# Patient Record
Sex: Female | Born: 1955
Health system: Southern US, Community
[De-identification: ages and names within clinical notes are randomized; demographics above are authoritative.]

## PROBLEM LIST (undated history)

## (undated) DIAGNOSIS — I10 Essential (primary) hypertension: Secondary | ICD-10-CM

## (undated) DIAGNOSIS — M8580 Other specified disorders of bone density and structure, unspecified site: Secondary | ICD-10-CM

## (undated) DIAGNOSIS — L719 Rosacea, unspecified: Secondary | ICD-10-CM

## (undated) DIAGNOSIS — N879 Dysplasia of cervix uteri, unspecified: Secondary | ICD-10-CM

## (undated) DIAGNOSIS — H44009 Unspecified purulent endophthalmitis, unspecified eye: Secondary | ICD-10-CM

## (undated) HISTORY — PX: MANDIBLE SURGERY: SHX707

## (undated) HISTORY — PX: EYE SURGERY: SHX253

## (undated) HISTORY — PX: PELVIC LAPAROSCOPY: SHX162

## (undated) HISTORY — DX: Dysplasia of cervix uteri, unspecified: N87.9

## (undated) HISTORY — DX: Essential (primary) hypertension: I10

## (undated) HISTORY — DX: Other specified disorders of bone density and structure, unspecified site: M85.80

## (undated) HISTORY — DX: Rosacea, unspecified: L71.9

## (undated) HISTORY — PX: APPENDECTOMY: SHX54

## (undated) HISTORY — DX: Unspecified purulent endophthalmitis, unspecified eye: H44.009

---

## 1989-01-27 HISTORY — PX: ABDOMINAL HYSTERECTOMY: SHX81

## 1997-12-28 ENCOUNTER — Other Ambulatory Visit: Admission: RE | Admit: 1997-12-28 | Discharge: 1997-12-28 | Payer: Self-pay | Admitting: Gynecology

## 1999-01-03 ENCOUNTER — Other Ambulatory Visit: Admission: RE | Admit: 1999-01-03 | Discharge: 1999-01-03 | Payer: Self-pay | Admitting: Gynecology

## 2000-01-08 ENCOUNTER — Other Ambulatory Visit: Admission: RE | Admit: 2000-01-08 | Discharge: 2000-01-08 | Payer: Self-pay | Admitting: Gynecology

## 2001-01-04 ENCOUNTER — Other Ambulatory Visit: Admission: RE | Admit: 2001-01-04 | Discharge: 2001-01-04 | Payer: Self-pay | Admitting: Gynecology

## 2001-12-29 ENCOUNTER — Other Ambulatory Visit: Admission: RE | Admit: 2001-12-29 | Discharge: 2001-12-29 | Payer: Self-pay | Admitting: Gynecology

## 2002-12-29 ENCOUNTER — Other Ambulatory Visit: Admission: RE | Admit: 2002-12-29 | Discharge: 2002-12-29 | Payer: Self-pay | Admitting: Gynecology

## 2004-01-25 ENCOUNTER — Other Ambulatory Visit: Admission: RE | Admit: 2004-01-25 | Discharge: 2004-01-25 | Payer: Self-pay | Admitting: Gynecology

## 2004-04-10 ENCOUNTER — Encounter: Payer: Self-pay | Admitting: Internal Medicine

## 2005-01-23 ENCOUNTER — Other Ambulatory Visit: Admission: RE | Admit: 2005-01-23 | Discharge: 2005-01-23 | Payer: Self-pay | Admitting: Gynecology

## 2005-05-15 ENCOUNTER — Other Ambulatory Visit: Admission: RE | Admit: 2005-05-15 | Discharge: 2005-05-15 | Payer: Self-pay | Admitting: Gynecology

## 2006-01-07 ENCOUNTER — Other Ambulatory Visit: Admission: RE | Admit: 2006-01-07 | Discharge: 2006-01-07 | Payer: Self-pay | Admitting: Gynecology

## 2006-07-15 ENCOUNTER — Other Ambulatory Visit: Admission: RE | Admit: 2006-07-15 | Discharge: 2006-07-15 | Payer: Self-pay | Admitting: Gynecology

## 2006-09-09 ENCOUNTER — Ambulatory Visit: Payer: Self-pay | Admitting: Internal Medicine

## 2006-09-09 DIAGNOSIS — J309 Allergic rhinitis, unspecified: Secondary | ICD-10-CM | POA: Insufficient documentation

## 2006-09-09 DIAGNOSIS — M1990 Unspecified osteoarthritis, unspecified site: Secondary | ICD-10-CM | POA: Insufficient documentation

## 2006-09-09 DIAGNOSIS — M949 Disorder of cartilage, unspecified: Secondary | ICD-10-CM

## 2006-09-09 DIAGNOSIS — N951 Menopausal and female climacteric states: Secondary | ICD-10-CM | POA: Insufficient documentation

## 2006-09-09 DIAGNOSIS — M899 Disorder of bone, unspecified: Secondary | ICD-10-CM | POA: Insufficient documentation

## 2006-09-09 DIAGNOSIS — I10 Essential (primary) hypertension: Secondary | ICD-10-CM | POA: Insufficient documentation

## 2006-09-23 ENCOUNTER — Encounter: Payer: Self-pay | Admitting: Internal Medicine

## 2006-11-13 ENCOUNTER — Ambulatory Visit: Payer: Self-pay | Admitting: Internal Medicine

## 2006-11-30 ENCOUNTER — Ambulatory Visit: Payer: Self-pay | Admitting: Internal Medicine

## 2006-11-30 DIAGNOSIS — J019 Acute sinusitis, unspecified: Secondary | ICD-10-CM | POA: Insufficient documentation

## 2006-12-14 ENCOUNTER — Telehealth (INDEPENDENT_AMBULATORY_CARE_PROVIDER_SITE_OTHER): Payer: Self-pay | Admitting: *Deleted

## 2006-12-14 ENCOUNTER — Ambulatory Visit: Payer: Self-pay | Admitting: Internal Medicine

## 2006-12-14 DIAGNOSIS — L509 Urticaria, unspecified: Secondary | ICD-10-CM | POA: Insufficient documentation

## 2006-12-30 ENCOUNTER — Other Ambulatory Visit: Admission: RE | Admit: 2006-12-30 | Discharge: 2006-12-30 | Payer: Self-pay | Admitting: Gynecology

## 2007-02-01 ENCOUNTER — Encounter: Admission: RE | Admit: 2007-02-01 | Discharge: 2007-02-01 | Payer: Self-pay | Admitting: Family Medicine

## 2007-12-30 ENCOUNTER — Other Ambulatory Visit: Admission: RE | Admit: 2007-12-30 | Discharge: 2007-12-30 | Payer: Self-pay | Admitting: Gynecology

## 2015-02-08 ENCOUNTER — Ambulatory Visit (INDEPENDENT_AMBULATORY_CARE_PROVIDER_SITE_OTHER): Payer: BLUE CROSS/BLUE SHIELD | Admitting: Gynecology

## 2015-02-08 ENCOUNTER — Encounter: Payer: Self-pay | Admitting: Gynecology

## 2015-02-08 VITALS — BP 140/88 | Ht 59.0 in | Wt 122.0 lb

## 2015-02-08 DIAGNOSIS — N952 Postmenopausal atrophic vaginitis: Secondary | ICD-10-CM

## 2015-02-08 DIAGNOSIS — Z01419 Encounter for gynecological examination (general) (routine) without abnormal findings: Secondary | ICD-10-CM

## 2015-02-08 DIAGNOSIS — N761 Subacute and chronic vaginitis: Secondary | ICD-10-CM | POA: Diagnosis not present

## 2015-02-08 DIAGNOSIS — Z79890 Hormone replacement therapy (postmenopausal): Secondary | ICD-10-CM

## 2015-02-08 MED ORDER — ESTRADIOL 0.05 MG/24HR TD PTTW: 1.0000 | MEDICATED_PATCH | TRANSDERMAL | Status: DC

## 2015-02-08 NOTE — Progress Notes (Signed)
Shelly Branch 10/19/1955 [id number]        60 y.o.  G1P1  for annual exam.  Former patient of Dr. Lorenso CourierMesser. Patient doing well without complaints  Past medical history,surgical history, problem list, medications, allergies, family history and social history were all reviewed and documented as reviewed in the EPIC chart.  ROS:  Performed with pertinent positives and negatives included in the history, assessment and plan.   Additional significant findings :  none   Exam: Kennon PortelaKim Gardner assistant Filed Vitals:   02/08/15 1051  BP: 140/88  Height: 4\' 11"  (1.499 m)  Weight: 122 lb (55.339 kg)   General appearance:  Normal affect, orientation and appearance. Skin: Grossly normal HEENT: Without gross lesions.  No cervical or supraclavicular adenopathy. Thyroid normal.  Lungs:  Clear without wheezing, rales or rhonchi Cardiac: RR, without RMG Abdominal:  Soft, nontender, without masses, guarding, rebound, organomegaly or hernia Breasts:  Examined lying and sitting without masses, retractions, discharge or axillary adenopathy. Pelvic:  Ext/BUS/vagina with atrophic changes  Adnexa  Without masses or tenderness    Anus and perineum  Normal   Rectovaginal  Normal sphincter tone without palpated masses or tenderness.    Assessment/Plan:  60 y.o. G1P1 female for annual exam.   1. Postmenopausal/atrophic genital changes.  Status post TAH/BSO 1991 for endometriosis. On Vivelle 0.05 mg patches. Doing well and wants to continue. Initially started due to unacceptable hot flushes and sweats started after her hysterectomy. I reviewed the whole issue of HRT to include the WHI study with increased risks of stroke heart attack DVT and possible breast cancer. Lowest dose for shortest period of time recommendations reviewed. After lengthy discussion the patient was to continue for now on I refilled her 1 year. 2. History of chronic yeast infections. Uses [facility]  twice weekly. Has done this for years. I discussed possible trying to stop and see how she does. At this point she does not want to do this but does have a supply at home. She'll call when she needs more. 3. Osteopenia. Patient reports having stable osteopenia. Last DEXA 2 years ago. Will obtain copy of this report and then make recommendations as to when to repeat this. Patient knows to call me in 2 weeks in follow up. 4. Pap smear reported 2016. No Pap smear done today.History of cryosurgery over 20-25 years ago. Normal Pap smear since then.  Discussed current screening guidelines and options to stop screening altogether versus less frequent screening intervals reviewed. Will readdress on annual basis. 5. Mammography 2 years ago. Patient knows she is overdue and is in the process of scheduling. SBE monthly reviewed. 6. Colonoscopy 2008 with planned repeat interval 10 years. 7. Health maintenance. No routine blood work done as patient reports this done at her primary physician's office. Mild elevated blood pressure 140/88 today. She is followed for hypertension and will recheck this in non exam situation.  Follow up 1 year, sooner as needed.   Dara LordsFONTAINE,Zeev Deakins P MD, 11:37 AM 02/08/2015

## 2015-02-08 NOTE — Patient Instructions (Signed)

## 2015-02-09 LAB — URINALYSIS W MICROSCOPIC + REFLEX CULTURE
BILIRUBIN URINE: NEGATIVE
Bacteria, UA: NONE SEEN [HPF]
CASTS: NONE SEEN [LPF]
CRYSTALS: NONE SEEN [HPF]
Glucose, UA: NEGATIVE
HGB URINE DIPSTICK: NEGATIVE
KETONES UR: NEGATIVE
Leukocytes, UA: NEGATIVE
Nitrite: NEGATIVE
PROTEIN: NEGATIVE
RBC / HPF: NONE SEEN RBC/HPF (ref <=2)
SPECIFIC GRAVITY, URINE: 1.021 (ref 1.001–1.035)
WBC UA: NONE SEEN WBC/HPF (ref <=5)
Yeast: NONE SEEN [HPF]
pH: 7 (ref 5.0–8.0)

## 2015-02-13 ENCOUNTER — Encounter: Payer: Self-pay | Admitting: Gynecology

## 2015-02-16 ENCOUNTER — Encounter: Payer: Self-pay | Admitting: Gynecology

## 2015-02-23 ENCOUNTER — Other Ambulatory Visit: Payer: Self-pay

## 2015-02-23 DIAGNOSIS — Z1231 Encounter for screening mammogram for malignant neoplasm of breast: Secondary | ICD-10-CM

## 2015-02-28 DIAGNOSIS — M8580 Other specified disorders of bone density and structure, unspecified site: Secondary | ICD-10-CM

## 2015-02-28 HISTORY — DX: Other specified disorders of bone density and structure, unspecified site: M85.80

## 2015-03-01 ENCOUNTER — Other Ambulatory Visit: Payer: Self-pay | Admitting: Gynecology

## 2015-03-01 DIAGNOSIS — M8580 Other specified disorders of bone density and structure, unspecified site: Secondary | ICD-10-CM

## 2015-03-09 ENCOUNTER — Ambulatory Visit: Payer: Self-pay

## 2015-03-20 ENCOUNTER — Ambulatory Visit (INDEPENDENT_AMBULATORY_CARE_PROVIDER_SITE_OTHER): Payer: BLUE CROSS/BLUE SHIELD

## 2015-03-20 ENCOUNTER — Other Ambulatory Visit: Payer: Self-pay | Admitting: Gynecology

## 2015-03-20 DIAGNOSIS — M899 Disorder of bone, unspecified: Secondary | ICD-10-CM

## 2015-03-20 DIAGNOSIS — M8580 Other specified disorders of bone density and structure, unspecified site: Secondary | ICD-10-CM

## 2015-03-20 DIAGNOSIS — Z13820 Encounter for screening for osteoporosis: Secondary | ICD-10-CM

## 2015-03-21 ENCOUNTER — Encounter: Payer: Self-pay | Admitting: Gynecology

## 2015-03-22 ENCOUNTER — Ambulatory Visit
Admission: RE | Admit: 2015-03-22 | Discharge: 2015-03-22 | Disposition: A | Discharge status: Home / Self Care | Payer: BLUE CROSS/BLUE SHIELD | Source: Ambulatory Visit

## 2015-03-22 DIAGNOSIS — Z1231 Encounter for screening mammogram for malignant neoplasm of breast: Secondary | ICD-10-CM

## 2015-05-15 ENCOUNTER — Telehealth: Payer: Self-pay | Admitting: *Deleted

## 2015-05-15 MED ORDER — NONFORMULARY OR COMPOUNDED ITEM: Status: DC

## 2015-05-15 NOTE — Telephone Encounter (Signed)
Pt called requesting refill on boric acid 600 mg twice weekly per note on 02/08/15 Rx will be sent. Should pt have any refills?

## 2015-05-15 NOTE — Telephone Encounter (Signed)
Pt aware, Rx sent. 

## 2015-05-15 NOTE — Telephone Encounter (Signed)
Refill 3 

## 2015-05-29 DIAGNOSIS — R002 Palpitations: Secondary | ICD-10-CM | POA: Diagnosis not present

## 2015-05-29 DIAGNOSIS — Z0000 Encounter for general adult medical examination without abnormal findings: Secondary | ICD-10-CM | POA: Diagnosis not present

## 2015-05-29 DIAGNOSIS — I10 Essential (primary) hypertension: Secondary | ICD-10-CM | POA: Diagnosis not present

## 2015-10-15 DIAGNOSIS — H01001 Unspecified blepharitis right upper eyelid: Secondary | ICD-10-CM | POA: Diagnosis not present

## 2015-10-15 DIAGNOSIS — H01004 Unspecified blepharitis left upper eyelid: Secondary | ICD-10-CM | POA: Diagnosis not present

## 2015-10-15 DIAGNOSIS — H5203 Hypermetropia, bilateral: Secondary | ICD-10-CM | POA: Diagnosis not present

## 2016-02-11 ENCOUNTER — Ambulatory Visit (INDEPENDENT_AMBULATORY_CARE_PROVIDER_SITE_OTHER): Payer: BLUE CROSS/BLUE SHIELD | Admitting: Gynecology

## 2016-02-11 ENCOUNTER — Telehealth: Payer: Self-pay | Admitting: *Deleted

## 2016-02-11 ENCOUNTER — Encounter: Payer: Self-pay | Admitting: Gynecology

## 2016-02-11 VITALS — BP 120/78 | Ht 59.0 in | Wt 123.0 lb

## 2016-02-11 DIAGNOSIS — Z79890 Hormone replacement therapy (postmenopausal): Secondary | ICD-10-CM

## 2016-02-11 DIAGNOSIS — N952 Postmenopausal atrophic vaginitis: Secondary | ICD-10-CM

## 2016-02-11 DIAGNOSIS — Z01411 Encounter for gynecological examination (general) (routine) with abnormal findings: Secondary | ICD-10-CM | POA: Diagnosis not present

## 2016-02-11 MED ORDER — NONFORMULARY OR COMPOUNDED ITEM: 3 refills | Status: DC

## 2016-02-11 MED ORDER — ESTRADIOL 0.05 MG/24HR TD PTTW: 1.0000 | MEDICATED_PATCH | TRANSDERMAL | 12 refills | Status: DC

## 2016-02-11 NOTE — Telephone Encounter (Signed)
-----   Message from Dara Lordsimothy P Fontaine, MD sent at 02/11/2016 11:51 AM EST ----- [facility] b[facility] acid vaginal suppository 600 mg 3 times weekly 3 month supply refill 1 year

## 2016-02-11 NOTE — Patient Instructions (Signed)

## 2016-02-11 NOTE — Progress Notes (Signed)
    Shelly LeyLinda Branch 09/19/1955 [id number]        61 y.o.  G1P1 for annual exam.    Past medical history,surgical history, problem list, medications, allergies, family history and social history were all reviewed and documented as reviewed in the EPIC chart.  ROS:  Performed with pertinent positives and negatives included in the history, assessment and plan.   Additional significant findings :  None   Exam: Kennon PortelaKim Gardner assistant Vitals:   02/11/16 1124  BP: 120/78  Weight: 123 lb (55.8 kg)  Height: 4\' 11"  (1.499 m)   Body mass index is 24.84 kg/m.  General appearance:  Normal affect, orientation and appearance. Skin: Grossly normal HEENT: Without gross lesions.  No cervical or supraclavicular adenopathy. Thyroid normal.  Lungs:  Clear without wheezing, rales or rhonchi Cardiac: RR, without RMG Abdominal:  Soft, nontender, without masses, guarding, rebound, organomegaly or hernia Breasts:  Examined lying and sitting without masses, retractions, discharge or axillary adenopathy. Pelvic:  Ext, BUS, Vagina with atrophic changes  Adnexa without masses or tenderness    Anus and perineum normal   Rectovaginal normal sphincter tone without palpated masses or tenderness.    Assessment/Plan:  61 y.o. G1P1 female for annual exam.   1. Postmenopausal/atrophic genital changes. Continues on Vivelle 0.05 mg patches doing well. Status post TAH/BSO 1991 for endometriosis. Reviewed most current 2017 [employer] guidelines on HRT. At this point patient wants to continue. Risks to include stroke heart attack DVT and possible breast cancer reviewed. Benefits as far as symptom relief and possible cardiovascular/bone health when started early discussed. Refill 1 year provided. 2. History of chronic vaginitis. Uses boric acid suppositories 2-3 times weekly with good response. [facility]. 3. Osteopenia. DEXA 02/2015 T score -1.8 FRAX 8%/0.9%. Stable from prior DEXA. Plan  repeat DEXA in another year or so.  4. Pap smear 2016. No Pap smear done today. History of cryosurgery over 20 years ago. Options to stop screening altogether per current screening guidelines and hysterectomy history reviewed. Will readdress on annual basis. 5. Mammography 02/2015. Continue with annual mammography when due. SBE monthly reviewed. 6. Colonoscopy 2008. Recommend follow up colonoscopy this coming year and patient will arrange. 7. Health maintenance. No routine blood work done as patient relates this done elsewhere. Follow up 1 year, sooner as needed.   Dara LordsFONTAINE,Jaymir Struble P MD, 11:53 AM 02/11/2016

## 2016-02-11 NOTE — Telephone Encounter (Signed)
Rx called in 

## 2016-05-15 DIAGNOSIS — R05 Cough: Secondary | ICD-10-CM | POA: Diagnosis not present

## 2016-05-15 DIAGNOSIS — J329 Chronic sinusitis, unspecified: Secondary | ICD-10-CM | POA: Diagnosis not present

## 2016-05-15 DIAGNOSIS — H6690 Otitis media, unspecified, unspecified ear: Secondary | ICD-10-CM | POA: Diagnosis not present

## 2016-06-06 DIAGNOSIS — Z8349 Family history of other endocrine, nutritional and metabolic diseases: Secondary | ICD-10-CM | POA: Diagnosis not present

## 2016-06-06 DIAGNOSIS — I10 Essential (primary) hypertension: Secondary | ICD-10-CM | POA: Diagnosis not present

## 2016-06-06 DIAGNOSIS — Z0000 Encounter for general adult medical examination without abnormal findings: Secondary | ICD-10-CM | POA: Diagnosis not present

## 2016-07-28 DIAGNOSIS — J0100 Acute maxillary sinusitis, unspecified: Secondary | ICD-10-CM | POA: Diagnosis not present

## 2016-10-22 DIAGNOSIS — H01004 Unspecified blepharitis left upper eyelid: Secondary | ICD-10-CM | POA: Diagnosis not present

## 2016-10-22 DIAGNOSIS — H01001 Unspecified blepharitis right upper eyelid: Secondary | ICD-10-CM | POA: Diagnosis not present

## 2016-10-22 DIAGNOSIS — H0014 Chalazion left upper eyelid: Secondary | ICD-10-CM | POA: Diagnosis not present

## 2017-02-02 DIAGNOSIS — M542 Cervicalgia: Secondary | ICD-10-CM | POA: Diagnosis not present

## 2017-02-11 ENCOUNTER — Encounter: Payer: Self-pay | Admitting: Gynecology

## 2017-02-11 ENCOUNTER — Ambulatory Visit (INDEPENDENT_AMBULATORY_CARE_PROVIDER_SITE_OTHER): Payer: BLUE CROSS/BLUE SHIELD | Admitting: Gynecology

## 2017-02-11 VITALS — BP 118/74 | Ht 59.0 in | Wt 124.0 lb

## 2017-02-11 DIAGNOSIS — M8580 Other specified disorders of bone density and structure, unspecified site: Secondary | ICD-10-CM | POA: Diagnosis not present

## 2017-02-11 DIAGNOSIS — Z01411 Encounter for gynecological examination (general) (routine) with abnormal findings: Secondary | ICD-10-CM | POA: Diagnosis not present

## 2017-02-11 DIAGNOSIS — Z79890 Hormone replacement therapy (postmenopausal): Secondary | ICD-10-CM

## 2017-02-11 DIAGNOSIS — N761 Subacute and chronic vaginitis: Secondary | ICD-10-CM | POA: Diagnosis not present

## 2017-02-11 DIAGNOSIS — N952 Postmenopausal atrophic vaginitis: Secondary | ICD-10-CM | POA: Diagnosis not present

## 2017-02-11 MED ORDER — ESTRADIOL 0.05 MG/24HR TD PTTW: 1.0000 | MEDICATED_PATCH | TRANSDERMAL | 12 refills | Status: DC

## 2017-02-11 NOTE — Patient Instructions (Addendum)
Follow-up for your bone density as scheduled.  Schedule your mammogram.  Schedule your colonoscopy  Follow-up in 1 year for your annual exam

## 2017-02-11 NOTE — Progress Notes (Signed)
    Peggye LeyLinda Liou 12/16/1955 [id number]        62 y.o.  G1P1 for annual gynecologic exam.  Doing well without gynecologic complaints.  Past medical history,surgical history, problem list, medications, allergies, family history and social history were all reviewed and documented as reviewed in the EPIC chart.  ROS:  Performed with pertinent positives and negatives included in the history, assessment and plan.   Additional significant findings : None   Exam: Kennon PortelaKim Gardner assistant Vitals:   02/11/17 1103  BP: 118/74  Weight: 124 lb (56.2 kg)  Height: 4\' 11"  (1.499 m)   Body mass index is 25.04 kg/m.  General appearance:  Normal affect, orientation and appearance. Skin: Grossly normal HEENT: Without gross lesions.  No cervical or supraclavicular adenopathy. Thyroid normal.  Lungs:  Clear without wheezing, rales or rhonchi Cardiac: RR, without RMG Abdominal:  Soft, nontender, without masses, guarding, rebound, organomegaly or hernia Breasts:  Examined lying and sitting without masses, retractions, discharge or axillary adenopathy. Pelvic:  Ext, BUS, Vagina: With atrophic changes  Adnexa: Without masses or tenderness    Anus and perineum: Normal   Rectovaginal: Normal sphincter tone without palpated masses or tenderness.    Assessment/Plan:  62 y.o. G1P1 female for annual gynecologic exam, status post TAH/BSO for endometriosis 1991.  1. HRT.  Patient continues on Vivelle 0.05 mg patches doing well and wants to continue.  We have discussed the risks to include thrombosis such as stroke heart attack DVT in the breast cancer issue as well as the possible cardiovascular and bone health benefits.  Patient wants to continue and I refilled her times 1 year. 2. History of chronic vaginitis.  Uses boric acid suppositories twice weekly with good suppression.  Has supply at home but will call when needs more. 3. Osteopenia.  DEXA 2017 T score -1.8 FRAX 8% / 0.9%.  Repeat DEXA now a 2-year  interval and patient will schedule. 4. Pap smear 2016.  Pap smear done today.  History of cryosurgery over 20 years ago.  Options to stop screening per current screening guidelines based on hysterectomy history reviewed.  Will readdress on an annual basis. 5. Colonoscopy 2008.  Need to schedule colonoscopy reviewed. 6. Mammography 2017.  Patient knows she needs to schedule and agrees to call and do so.  Breast exam normal today. 7. Health maintenance.  No routine lab work done as patient does this elsewhere.  Follow-up for DEXA, mammogram and colonoscopy.  Follow-up in 1 year for annual exam   Dara Lordsimothy P Fontaine MD, 11:47 AM 02/11/2017

## 2017-02-13 LAB — PAP IG W/ RFLX HPV ASCU

## 2017-03-02 DIAGNOSIS — M542 Cervicalgia: Secondary | ICD-10-CM | POA: Diagnosis not present

## 2017-03-04 DIAGNOSIS — J329 Chronic sinusitis, unspecified: Secondary | ICD-10-CM | POA: Diagnosis not present

## 2017-03-23 ENCOUNTER — Other Ambulatory Visit: Payer: Self-pay

## 2017-03-23 MED ORDER — NONFORMULARY OR COMPOUNDED ITEM: 0 refills | Status: DC

## 2017-03-24 DIAGNOSIS — J209 Acute bronchitis, unspecified: Secondary | ICD-10-CM | POA: Diagnosis not present

## 2017-03-24 DIAGNOSIS — J069 Acute upper respiratory infection, unspecified: Secondary | ICD-10-CM | POA: Diagnosis not present

## 2017-04-01 DIAGNOSIS — J209 Acute bronchitis, unspecified: Secondary | ICD-10-CM | POA: Diagnosis not present

## 2017-05-04 DIAGNOSIS — D23121 Other benign neoplasm of skin of left upper eyelid, including canthus: Secondary | ICD-10-CM | POA: Diagnosis not present

## 2017-05-27 DIAGNOSIS — M542 Cervicalgia: Secondary | ICD-10-CM | POA: Diagnosis not present

## 2017-06-23 ENCOUNTER — Other Ambulatory Visit: Payer: Self-pay | Admitting: Gynecology

## 2017-06-23 DIAGNOSIS — Z1231 Encounter for screening mammogram for malignant neoplasm of breast: Secondary | ICD-10-CM

## 2017-06-25 ENCOUNTER — Ambulatory Visit
Admission: RE | Admit: 2017-06-25 | Discharge: 2017-06-25 | Disposition: A | Discharge status: Home / Self Care | Payer: Self-pay | Source: Ambulatory Visit | Attending: Gynecology | Admitting: Gynecology

## 2017-06-25 DIAGNOSIS — Z1231 Encounter for screening mammogram for malignant neoplasm of breast: Secondary | ICD-10-CM

## 2017-06-29 DIAGNOSIS — D23121 Other benign neoplasm of skin of left upper eyelid, including canthus: Secondary | ICD-10-CM | POA: Diagnosis not present

## 2017-07-08 DIAGNOSIS — H00014 Hordeolum externum left upper eyelid: Secondary | ICD-10-CM | POA: Diagnosis not present

## 2017-07-08 DIAGNOSIS — H1032 Unspecified acute conjunctivitis, left eye: Secondary | ICD-10-CM | POA: Diagnosis not present

## 2017-07-20 DIAGNOSIS — H10223 Pseudomembranous conjunctivitis, bilateral: Secondary | ICD-10-CM | POA: Diagnosis not present

## 2017-07-22 DIAGNOSIS — H10223 Pseudomembranous conjunctivitis, bilateral: Secondary | ICD-10-CM | POA: Diagnosis not present

## 2017-07-24 DIAGNOSIS — H10223 Pseudomembranous conjunctivitis, bilateral: Secondary | ICD-10-CM | POA: Diagnosis not present

## 2017-07-27 ENCOUNTER — Other Ambulatory Visit: Payer: Self-pay

## 2017-07-27 MED ORDER — ESTRADIOL 0.05 MG/24HR TD PTTW: 1.0000 | MEDICATED_PATCH | TRANSDERMAL | 2 refills | Status: DC

## 2017-07-27 NOTE — Telephone Encounter (Signed)
Pharmacy requesting 90 days supply for Estradiol. Rx sent.

## 2017-07-28 DIAGNOSIS — H10223 Pseudomembranous conjunctivitis, bilateral: Secondary | ICD-10-CM | POA: Diagnosis not present

## 2017-07-31 ENCOUNTER — Telehealth: Payer: Self-pay | Admitting: *Deleted

## 2017-07-31 MED ORDER — NONFORMULARY OR COMPOUNDED ITEM: 1 refills | Status: DC

## 2017-07-31 NOTE — Telephone Encounter (Signed)
Okay to refill supply x3

## 2017-07-31 NOTE — Telephone Encounter (Signed)
Rx called in 

## 2017-07-31 NOTE — Telephone Encounter (Signed)
Patient requesting refill on boric acid suppositories twice weekly. Patient has ran out of supply at home.  Any refills on Rx?

## 2017-08-04 DIAGNOSIS — H10223 Pseudomembranous conjunctivitis, bilateral: Secondary | ICD-10-CM | POA: Diagnosis not present

## 2017-08-04 DIAGNOSIS — H1789 Other corneal scars and opacities: Secondary | ICD-10-CM | POA: Diagnosis not present

## 2017-08-18 DIAGNOSIS — H10223 Pseudomembranous conjunctivitis, bilateral: Secondary | ICD-10-CM | POA: Diagnosis not present

## 2017-08-18 DIAGNOSIS — H1789 Other corneal scars and opacities: Secondary | ICD-10-CM | POA: Diagnosis not present

## 2017-09-01 DIAGNOSIS — H1789 Other corneal scars and opacities: Secondary | ICD-10-CM | POA: Diagnosis not present

## 2017-09-03 DIAGNOSIS — H019 Unspecified inflammation of eyelid: Secondary | ICD-10-CM | POA: Diagnosis not present

## 2017-09-03 DIAGNOSIS — H1789 Other corneal scars and opacities: Secondary | ICD-10-CM | POA: Diagnosis not present

## 2017-09-08 DIAGNOSIS — H10223 Pseudomembranous conjunctivitis, bilateral: Secondary | ICD-10-CM | POA: Diagnosis not present

## 2017-09-16 DIAGNOSIS — H10223 Pseudomembranous conjunctivitis, bilateral: Secondary | ICD-10-CM | POA: Diagnosis not present

## 2017-09-22 DIAGNOSIS — H10223 Pseudomembranous conjunctivitis, bilateral: Secondary | ICD-10-CM | POA: Diagnosis not present

## 2017-10-06 DIAGNOSIS — R5383 Other fatigue: Secondary | ICD-10-CM | POA: Diagnosis not present

## 2017-10-06 DIAGNOSIS — B300 Keratoconjunctivitis due to adenovirus: Secondary | ICD-10-CM | POA: Diagnosis not present

## 2017-10-06 DIAGNOSIS — R682 Dry mouth, unspecified: Secondary | ICD-10-CM | POA: Diagnosis not present

## 2017-12-11 DIAGNOSIS — H01001 Unspecified blepharitis right upper eyelid: Secondary | ICD-10-CM | POA: Diagnosis not present

## 2017-12-11 DIAGNOSIS — H2513 Age-related nuclear cataract, bilateral: Secondary | ICD-10-CM | POA: Diagnosis not present

## 2017-12-11 DIAGNOSIS — H5203 Hypermetropia, bilateral: Secondary | ICD-10-CM | POA: Diagnosis not present

## 2017-12-11 DIAGNOSIS — H01004 Unspecified blepharitis left upper eyelid: Secondary | ICD-10-CM | POA: Diagnosis not present

## 2018-02-10 DIAGNOSIS — H10403 Unspecified chronic conjunctivitis, bilateral: Secondary | ICD-10-CM | POA: Diagnosis not present

## 2018-02-15 ENCOUNTER — Encounter: Payer: Self-pay | Admitting: Gynecology

## 2018-02-15 ENCOUNTER — Ambulatory Visit (INDEPENDENT_AMBULATORY_CARE_PROVIDER_SITE_OTHER): Payer: BLUE CROSS/BLUE SHIELD | Admitting: Gynecology

## 2018-02-15 VITALS — BP 134/84 | Ht 59.0 in | Wt 126.0 lb

## 2018-02-15 DIAGNOSIS — M8580 Other specified disorders of bone density and structure, unspecified site: Secondary | ICD-10-CM

## 2018-02-15 DIAGNOSIS — N761 Subacute and chronic vaginitis: Secondary | ICD-10-CM | POA: Diagnosis not present

## 2018-02-15 DIAGNOSIS — N952 Postmenopausal atrophic vaginitis: Secondary | ICD-10-CM | POA: Diagnosis not present

## 2018-02-15 DIAGNOSIS — Z01419 Encounter for gynecological examination (general) (routine) without abnormal findings: Secondary | ICD-10-CM

## 2018-02-15 MED ORDER — ESTRADIOL 0.05 MG/24HR TD PTTW: 1.0000 | MEDICATED_PATCH | TRANSDERMAL | 4 refills | Status: DC

## 2018-02-15 NOTE — Progress Notes (Signed)
    Shelly Branch [birth year] [id number]        63 y.o.  G1P1 for annual gynecologic exam.  Without gynecologic complaints  Past medical history,surgical history, problem list, medications, allergies, family history and social history were all reviewed and documented as reviewed in the EPIC chart.  ROS:  Performed with pertinent positives and negatives included in the history, assessment and plan.   Additional significant findings : None   Exam: [name] assistant Vitals:   02/15/18 1103  BP: 134/84  Weight: 126 lb (57.2 kg)  Height: 4\' 11"  (1.499 m)   Body mass index is 25.45 kg/m.  General appearance:  Normal affect, orientation and appearance. Skin: Grossly normal HEENT: Without gross lesions.  No cervical or supraclavicular adenopathy. Thyroid normal.  Lungs:  Clear without wheezing, rales or rhonchi Cardiac: RR, without RMG Abdominal:  Soft, nontender, without masses, guarding, rebound, organomegaly or hernia Breasts:  Examined lying and sitting without masses, retractions, discharge or axillary adenopathy. Pelvic:  Ext, BUS, Vagina: With atrophic changes.  Adnexa: Without masses or tenderness    Anus and perineum: Normal   Rectovaginal: Normal sphincter tone without palpated masses or tenderness.    Assessment/Plan:  63 y.o. G1P1 female for annual gynecologic exam.  Status post TAH/BSO 1991 for endometriosis.  1. HRT.  Continues on Vivelle 0.5 mg patches.  Strongly desires to continue.  Reviewed risks versus benefits to include thrombosis such as stroke heart attack DVT in the breast cancer issue.  Benefits to include symptom relief as well as cardiovascular and bone health also discussed.  Refill x1 year provided. 2. History of chronic vaginitis.  Using boric acid suppositories 600 mg 3 times weekly doing well with this.  Refill x1 year provided. 3. Pap smear 2019.  No Pap smear done today.  History of cryosurgery over 20 years ago.  Options to stop screening per  current screening guidelines based on hysterectomy history reviewed.  Will readdress on an annual basis. 4. Osteopenia.  DEXA 2017 T score -1.8 FRAX 8% / 0.9%.  Was to schedule follow-up DEXA but failed to do so last year.  Reminded patient to schedule this year and she agrees to do so. 5. Colonoscopy overdue and patient reminded to schedule. 6. Mammography 05/2017.  Continue with annual mammography when due.  Breast exam normal today. 7. Health maintenance.  No routine lab work done as patient does this elsewhere.  Follow-up in 1 year, sooner as needed.   Dara Lords MD, 11:41 AM 02/15/2018

## 2018-02-15 NOTE — Patient Instructions (Signed)
Schedule in follow-up for bone density  Follow-up in 1 year for annual exam

## 2018-02-16 ENCOUNTER — Telehealth: Payer: Self-pay | Admitting: *Deleted

## 2018-02-16 MED ORDER — NONFORMULARY OR COMPOUNDED ITEM: 3 refills | Status: DC

## 2018-02-16 NOTE — Telephone Encounter (Signed)
Rx called in 

## 2018-02-16 NOTE — Telephone Encounter (Signed)
-----   Message from Dara Lords, MD sent at 02/15/2018 11:38 AM EST ----- [facility] b[facility] acid vaginal suppositories 600 mg 1 per vagina 3 times weekly 21-month supply 1 year refill

## 2018-02-19 DIAGNOSIS — R1012 Left upper quadrant pain: Secondary | ICD-10-CM | POA: Diagnosis not present

## 2018-02-25 DIAGNOSIS — K5732 Diverticulitis of large intestine without perforation or abscess without bleeding: Secondary | ICD-10-CM | POA: Diagnosis not present

## 2018-02-25 DIAGNOSIS — R109 Unspecified abdominal pain: Secondary | ICD-10-CM | POA: Diagnosis not present

## 2018-03-01 ENCOUNTER — Other Ambulatory Visit: Payer: Self-pay | Admitting: Family Medicine

## 2018-03-01 DIAGNOSIS — K5732 Diverticulitis of large intestine without perforation or abscess without bleeding: Secondary | ICD-10-CM

## 2018-03-04 ENCOUNTER — Ambulatory Visit
Admission: RE | Admit: 2018-03-04 | Discharge: 2018-03-04 | Disposition: A | Discharge status: Home / Self Care | Payer: BLUE CROSS/BLUE SHIELD | Source: Ambulatory Visit | Attending: Family Medicine | Admitting: Family Medicine

## 2018-03-04 DIAGNOSIS — K5732 Diverticulitis of large intestine without perforation or abscess without bleeding: Secondary | ICD-10-CM

## 2018-03-04 DIAGNOSIS — Z8719 Personal history of other diseases of the digestive system: Secondary | ICD-10-CM | POA: Diagnosis not present

## 2018-03-04 MED ORDER — IOPAMIDOL (ISOVUE-300) INJECTION 61%
100.0000 mL | Freq: Once | INTRAVENOUS | Status: AC | PRN
  Administered 2018-03-04: 100 mL via INTRAVENOUS

## 2018-03-11 DIAGNOSIS — I10 Essential (primary) hypertension: Secondary | ICD-10-CM | POA: Diagnosis not present

## 2018-03-11 DIAGNOSIS — R197 Diarrhea, unspecified: Secondary | ICD-10-CM | POA: Diagnosis not present

## 2018-03-15 DIAGNOSIS — H01001 Unspecified blepharitis right upper eyelid: Secondary | ICD-10-CM | POA: Diagnosis not present

## 2018-03-15 DIAGNOSIS — H25013 Cortical age-related cataract, bilateral: Secondary | ICD-10-CM | POA: Diagnosis not present

## 2018-03-15 DIAGNOSIS — H5310 Unspecified subjective visual disturbances: Secondary | ICD-10-CM | POA: Diagnosis not present

## 2018-04-06 ENCOUNTER — Encounter: Payer: Self-pay | Admitting: Gynecology

## 2018-04-06 ENCOUNTER — Ambulatory Visit (INDEPENDENT_AMBULATORY_CARE_PROVIDER_SITE_OTHER): Payer: BLUE CROSS/BLUE SHIELD | Admitting: Gynecology

## 2018-04-06 VITALS — BP 120/74

## 2018-04-06 DIAGNOSIS — K648 Other hemorrhoids: Secondary | ICD-10-CM | POA: Diagnosis not present

## 2018-04-06 DIAGNOSIS — N898 Other specified noninflammatory disorders of vagina: Secondary | ICD-10-CM | POA: Diagnosis not present

## 2018-04-06 LAB — WET PREP FOR TRICH, YEAST, CLUE

## 2018-04-06 MED ORDER — FLUCONAZOLE 150 MG PO TABS
150.0000 mg | ORAL_TABLET | Freq: Every day | ORAL | 0 refills | Status: DC
Start: 2018-04-06 — End: 2018-04-28

## 2018-04-06 NOTE — Addendum Note (Signed)
Addended by: Dayna Barker on: 04/06/2018 12:29 PM   Modules accepted: Orders

## 2018-04-06 NOTE — Patient Instructions (Signed)
Take the Diflucan pill daily for 3 days.  Follow-up if the symptoms persist, worsen or recur.

## 2018-04-06 NOTE — Progress Notes (Signed)
    Shelly Branch 05/06/1955 [id number]        63 y.o.  G1P1 presents complaining of vaginal pruritus following a course of antibiotics for diverticulitis.  Uses boric acid suppositories on a regular basis due to a history of chronic vaginitis but this seems different.  Also having a flare in her hemorrhoid.  Past medical history,surgical history, problem list, medications, allergies, family history and social history were all reviewed and documented in the EPIC chart.  Directed ROS with pertinent positives and negatives documented in the history of present illness/assessment and plan.  Exam: [name] assistant Vitals:   04/06/18 0935  BP: 120/74   General appearance:  Normal Abdomen soft nontender without masses guarding rebound Pelvic external BUS vagina with whitish discharge.  Atrophic changes noted.  Bimanual without masses or tenderness.  External hemorrhoid noted without evidence of fissure.  Assessment/Plan:  63 y.o. G1P1 with history as above.  Wet prep is positive for yeast.  Options for management reviewed and ultimately she prefers pills.  We will go with Diflucan 150 mg daily x3 days to try to eradicate any skin involvement.  This may also be irritating her hemorrhoid.  She does have an appointment with a gastroenterologist coming up and she will address the hemorrhoid with him.  She will follow-up with me if discharge or irritation continues.    Dara Lords MD, 9:45 AM 04/06/2018

## 2018-04-14 ENCOUNTER — Ambulatory Visit (INDEPENDENT_AMBULATORY_CARE_PROVIDER_SITE_OTHER): Payer: BLUE CROSS/BLUE SHIELD | Admitting: Gynecology

## 2018-04-14 ENCOUNTER — Encounter: Payer: Self-pay | Admitting: Gynecology

## 2018-04-14 ENCOUNTER — Other Ambulatory Visit: Payer: Self-pay

## 2018-04-14 VITALS — BP 130/80

## 2018-04-14 DIAGNOSIS — B373 Candidiasis of vulva and vagina: Secondary | ICD-10-CM

## 2018-04-14 DIAGNOSIS — B3731 Acute candidiasis of vulva and vagina: Secondary | ICD-10-CM

## 2018-04-14 DIAGNOSIS — N898 Other specified noninflammatory disorders of vagina: Secondary | ICD-10-CM | POA: Diagnosis not present

## 2018-04-14 LAB — WET PREP FOR TRICH, YEAST, CLUE

## 2018-04-14 MED ORDER — TERCONAZOLE 0.4 % VA CREA: 1.0000 | TOPICAL_CREAM | Freq: Every day | VAGINAL | 0 refills | Status: DC

## 2018-04-14 MED ORDER — NYSTATIN-TRIAMCINOLONE 100000-0.1 UNIT/GM-% EX OINT: 1.0000 "application " | TOPICAL_OINTMENT | Freq: Two times a day (BID) | CUTANEOUS | 0 refills | Status: DC

## 2018-04-14 NOTE — Progress Notes (Signed)
    Shelly Branch 1955-10-09 [id number]        63 y.o.  G1P1 presents with persistence of her vaginal irritation and itching.  Was seen 04/06/2018 and treated for yeast with Diflucan 150 mg daily x3 days given a history of resistant yeast in the past.  Had also been using boric acid suppositories.  Notes that her symptoms seem to a touch better but still persisting.  No urinary symptoms such as frequency dysuria urgency low back pain fever or chills.  Past medical history,surgical history, problem list, medications, allergies, family history and social history were all reviewed and documented in the EPIC chart.  Directed ROS with pertinent positives and negatives documented in the history of present illness/assessment and plan.  Exam: [name] assistant Vitals:   04/14/18 1207  BP: 130/80   General appearance:  Normal Abdomen soft nontender without masses guarding rebound Pelvic external BUS vagina with scant white discharge.  Bimanual without masses or tenderness.  Assessment/Plan:  63 y.o. G1P1 with above history and exam.  Wet prep is positive for yeast.  Will treat as a resistant Candida with Terazol 7 day cream.  I also gave her a separate prescription for Mytrex equivalent to use as needed for external irritation.  She will follow-up if her symptoms persist, worsen or recur.    Shelly Lords MD, 12:43 PM 04/14/2018

## 2018-04-14 NOTE — Patient Instructions (Signed)
Use the Terazol vaginal cream nightly x7 nights. Use the nystatin triamcinolone cream externally for irritation/itching.  Follow-up if your symptoms persist, worsen or recur.

## 2018-04-20 ENCOUNTER — Other Ambulatory Visit: Payer: Self-pay | Admitting: Gynecology

## 2018-04-23 ENCOUNTER — Telehealth: Payer: Self-pay | Admitting: *Deleted

## 2018-04-23 NOTE — Telephone Encounter (Signed)
Patient informed. 

## 2018-04-23 NOTE — Telephone Encounter (Signed)
I am wondering if the boric acid is not causing the irritation and discharge.  I would stop the boric acid for now and use the nystatin externally as needed and let things calm down to see if the irritation does not resolve spontaneously.

## 2018-04-23 NOTE — Telephone Encounter (Signed)
Patient treated for yeast on OV 04/14/18 with Terazol 7 days cream, used the cream 1st night and went to bathroom afterwards and some cream came out. Started using bathroom before inserting cream, completed dose on 04/20/18.  Restarted boric acid supp on Wednesday now noticing external burning/irritation/redness and white discharge. She has not used the nystatin cream yet. She wanted to know if you thought just using the boric acid & nystatin should enough? Patient did admit having a "rough" yeast issue in past which took a long time to end. Please advise

## 2018-04-28 ENCOUNTER — Ambulatory Visit (INDEPENDENT_AMBULATORY_CARE_PROVIDER_SITE_OTHER): Payer: BLUE CROSS/BLUE SHIELD | Admitting: Gynecology

## 2018-04-28 ENCOUNTER — Other Ambulatory Visit: Payer: Self-pay

## 2018-04-28 ENCOUNTER — Encounter: Payer: Self-pay | Admitting: Gynecology

## 2018-04-28 VITALS — BP 120/80

## 2018-04-28 DIAGNOSIS — B3731 Acute candidiasis of vulva and vagina: Secondary | ICD-10-CM

## 2018-04-28 DIAGNOSIS — B373 Candidiasis of vulva and vagina: Secondary | ICD-10-CM | POA: Diagnosis not present

## 2018-04-28 LAB — WET PREP FOR TRICH, YEAST, CLUE

## 2018-04-28 MED ORDER — CLOBETASOL PROPIONATE 0.05 % EX CREA: TOPICAL_CREAM | CUTANEOUS | 1 refills | Status: DC

## 2018-04-28 NOTE — Addendum Note (Signed)
Addended by: Dayna Barker on: 04/28/2018 12:06 PM   Modules accepted: Orders

## 2018-04-28 NOTE — Progress Notes (Signed)
    Shelly Branch 05-14-1955 [id number]        63 y.o.  G1P1 presents with continuing/recurrent vulvar burning.  Initially seen this past month after taking antibiotics for diverticulitis.  Had been using boric acid suppositories that she does on a regular basis because of a history of chronic refractile vaginitis in the past.  Was treated with Diflucan x3 days.  She re-presented with continued symptoms.  Wet prep was positive for yeast.  She was treated with Terazol 7 day cream and noted a transient improvement in her symptoms but then at the end of the course of the cream a return of the burning.  Has used boric acid suppositories several times since with persistence of her symptoms.  Past medical history,surgical history, problem list, medications, allergies, family history and social history were all reviewed and documented in the EPIC chart.  Directed ROS with pertinent positives and negatives documented in the history of present illness/assessment and plan.  Exam: [name] assistant Vitals:   04/28/18 1129  BP: 120/80   General appearance:  Normal Abdomen soft nontender without masses guarding rebound Pelvic external BUS vagina with atrophic changes.  Scant white discharge noted.  Bimanual without masses or tenderness  Assessment/Plan:  63 y.o. G1P1 with above history and exam.  Wet prep is positive for yeast.  We discussed various strategies.  She had a lot of GI upset with the Diflucan that she took.  She did have episodes of resistant yeast in the past where she used daily boric acid suppositories for 2 weeks and then tapered off of them and this worked for her sometimes.  Alternative would be to use Terazol vaginal cream x14 days.  Regardless will treat external symptoms with clobetasol 0.05% nightly.  At this point she would like to try the boric acid daily.  She will call in several days if she does not notice an improvement and then will consider the terconazole x14 days.   Dara Lords MD, 11:44 AM 04/28/2018

## 2018-04-28 NOTE — Patient Instructions (Signed)
Use the boric acid suppositories daily for 2 weeks.  Apply the clobetasol cream externally at bedtime to see if this does not help with the burning symptoms.  Call beginning of next week if your symptoms are not improving.

## 2018-05-05 ENCOUNTER — Other Ambulatory Visit: Payer: Self-pay

## 2018-05-06 ENCOUNTER — Encounter: Payer: Self-pay | Admitting: Gynecology

## 2018-05-06 ENCOUNTER — Ambulatory Visit (INDEPENDENT_AMBULATORY_CARE_PROVIDER_SITE_OTHER): Payer: BLUE CROSS/BLUE SHIELD | Admitting: Gynecology

## 2018-05-06 VITALS — BP 118/76

## 2018-05-06 DIAGNOSIS — N760 Acute vaginitis: Secondary | ICD-10-CM | POA: Diagnosis not present

## 2018-05-06 LAB — WET PREP FOR TRICH, YEAST, CLUE

## 2018-05-06 MED ORDER — TERCONAZOLE 0.4 % VA CREA: 1.0000 | TOPICAL_CREAM | Freq: Every day | VAGINAL | 0 refills | Status: DC

## 2018-05-06 NOTE — Patient Instructions (Signed)
Use the Terazol vaginal cream nightly for 2 weeks in a row.  Call if persistent symptoms

## 2018-05-06 NOTE — Addendum Note (Signed)
Addended by: Dayna Barker on: 05/06/2018 01:36 PM   Modules accepted: Orders

## 2018-05-06 NOTE — Progress Notes (Signed)
    Shelly Branch [birth year] [id number]        63 y.o.  G1P1 presents with recurrent vaginitis symptoms.  Recently seen with yeast vaginitis.  She had been treated previously with Diflucan and Terazol as well as boric acid suppositories.  She elected for boric acid suppositories when last seen and has been using them.  Initially had improvement in her symptoms for 5 days or so but over the last day or 2 worsening symptoms.  Has applied clobetasol cream externally but notes that this is also causing burning on application.  Past medical history,surgical history, problem list, medications, allergies, family history and social history were all reviewed and documented in the EPIC chart.  Directed ROS with pertinent positives and negatives documented in the history of present illness/assessment and plan.  Exam: [name] assistant Vitals:   05/06/18 1030  BP: 118/76   General appearance:  Normal Abdomen soft nontender without masses guarding rebound Pelvic external BUS vagina with atrophic changes.  White discharge noted.  Bimanual without masses or tenderness.  Assessment/Plan:  63 y.o. G1P1 with history as above.  Wet prep shows yeast.  We discussed several different strategies.  This all started after several weeks of oral antibiotics for a GI issue.  Does not seem to be able to tolerate Diflucan from a GI standpoint.  Possible use of a different oral antifungal discussed.  For now we will stop boric acid altogether as this may also contribute to irritation and used Terazol 7 cream for 2 weeks in a row to see if we cannot eradicate the symptoms.  She will call me in follow-up to see how she is doing.  Dara Lords MD, 10:41 AM 05/06/2018

## 2018-05-07 LAB — CBC WITH DIFFERENTIAL/PLATELET
Absolute Monocytes: 454 cells/uL (ref 200–950)
Basophils Absolute: 8 cells/uL (ref 0–200)
Basophils Relative: 0.2 %
Eosinophils Absolute: 8 cells/uL — ABNORMAL LOW (ref 15–500)
Eosinophils Relative: 0.2 %
HCT: 38.4 % (ref 35.0–45.0)
Hemoglobin: 13.1 g/dL (ref 11.7–15.5)
Lymphs Abs: 857 cells/uL (ref 850–3900)
MCH: 30.4 pg (ref 27.0–33.0)
MCHC: 34.1 g/dL (ref 32.0–36.0)
MCV: 89.1 fL (ref 80.0–100.0)
MPV: 10.2 fL (ref 7.5–12.5)
Monocytes Relative: 10.8 %
Neutro Abs: 2873 cells/uL (ref 1500–7800)
Neutrophils Relative %: 68.4 %
Platelets: 283 10*3/uL (ref 140–400)
RBC: 4.31 10*6/uL (ref 3.80–5.10)
RDW: 12.1 % (ref 11.0–15.0)
Total Lymphocyte: 20.4 %
WBC: 4.2 10*3/uL (ref 3.8–10.8)

## 2018-05-07 LAB — VITAMIN D 25 HYDROXY (VIT D DEFICIENCY, FRACTURES): Vit D, 25-Hydroxy: 35 ng/mL (ref 30–100)

## 2018-05-10 ENCOUNTER — Encounter: Payer: Self-pay | Admitting: Gynecology

## 2018-05-27 ENCOUNTER — Telehealth: Payer: Self-pay

## 2018-05-27 NOTE — Telephone Encounter (Signed)
Patient called stating she finished 2 weeks of Terazol and the next night was fine.  But the following night she "saw yeast on the outside" and started using the Boric Acid supp. Again.  She said she "sees the yeast decreasing on the outside but still some irritation and burning on and off".  I called Dr. Velvet Bathe by phone and spoke with him.  He was well aware of her case. He recommended once again that she stop using the Boric Acid Supp. I explained this to her and that he believes this is irritating her skin.  I also advised her that Dr. Velvet Bathe said to pay less attention to how it looks and more how she is feeling.  To see how the weekend goes and will call as needed.

## 2018-06-08 ENCOUNTER — Encounter: Payer: Self-pay | Admitting: Gynecology

## 2018-06-09 ENCOUNTER — Ambulatory Visit (INDEPENDENT_AMBULATORY_CARE_PROVIDER_SITE_OTHER): Payer: BLUE CROSS/BLUE SHIELD | Admitting: Gynecology

## 2018-06-09 ENCOUNTER — Encounter: Payer: Self-pay | Admitting: Gynecology

## 2018-06-09 ENCOUNTER — Other Ambulatory Visit: Payer: Self-pay

## 2018-06-09 VITALS — BP 124/80

## 2018-06-09 DIAGNOSIS — B373 Candidiasis of vulva and vagina: Secondary | ICD-10-CM

## 2018-06-09 DIAGNOSIS — B3731 Acute candidiasis of vulva and vagina: Secondary | ICD-10-CM

## 2018-06-09 DIAGNOSIS — R300 Dysuria: Secondary | ICD-10-CM

## 2018-06-09 LAB — WET PREP FOR TRICH, YEAST, CLUE

## 2018-06-09 MED ORDER — TERCONAZOLE 0.4 % VA CREA: 1.0000 | TOPICAL_CREAM | Freq: Every day | VAGINAL | 0 refills | Status: DC

## 2018-06-09 NOTE — Progress Notes (Signed)
    Shelly Branch 02-26-1955 [id number]        63 y.o.  G1P1 presents with persistence of her vaginal burning.  History of recurrent yeast vaginitis.  Most recently was treated with 2 weeks of terconazole.  Subsequently had discharge with irritation and went back on boric acid suppositories.  Felt that the burning was getting worse and at that point we asked her to stop everything to see if things do not resolve.  She subsequently restarted boric acid twice weekly.  Now having significant burning.  No real significant discharge.  Mild dysuria.  No urgency, frequency, low back pain, fever or chills.  Past medical history,surgical history, problem list, medications, allergies, family history and social history were all reviewed and documented in the EPIC chart.  Directed ROS with pertinent positives and negatives documented in the history of present illness/assessment and plan.  Exam: Bari Mantis assistant Vitals:   06/09/18 1233  BP: 124/80   General appearance:  Normal Spine straight without CVA tenderness Abdomen soft nontender without masses guarding rebound Pelvic external BUS vagina with atrophic changes.  Thick white discharge noted.  Bimanual without masses or tenderness.  Assessment/Plan:  63 y.o. G1P1 with history as above.  Wet prep positive for yeast.  Urine analysis positive for yeast.  10-20 WBC, 10-20 squamous cells and few bacteria noted.  Questionable contaminated.  Will follow up on culture and treat if positive.  Also did a yeast culture and sensitivity to see if we can identify the species and sensitivities to better direct our therapy.  As she seemed to improve on Terazol go ahead and treat her transiently now with Terazol 7 day cream and then follow-up on these culture.    Dara Lords MD, 1:59 PM 06/09/2018

## 2018-06-09 NOTE — Patient Instructions (Signed)
Use the Terazol vaginal cream nightly.  The office will call you with the yeast culture results.

## 2018-06-09 NOTE — Addendum Note (Signed)
Addended by: Dayna Barker on: 06/09/2018 02:10 PM   Modules accepted: Orders

## 2018-06-09 NOTE — Telephone Encounter (Signed)
Sounds like she needs to be seen Olegario Messier

## 2018-06-10 ENCOUNTER — Encounter: Payer: Self-pay | Admitting: Gynecology

## 2018-06-10 LAB — WET PREP BY MOLECULAR PROBE
Candida species: DETECTED — AB
Gardnerella vaginalis: NOT DETECTED
MICRO NUMBER:: 470602
SPECIMEN QUALITY:: ADEQUATE
Trichomonas vaginosis: NOT DETECTED

## 2018-06-11 LAB — URINALYSIS, COMPLETE W/RFL CULTURE
Bilirubin Urine: NEGATIVE
Glucose, UA: NEGATIVE
Hgb urine dipstick: NEGATIVE
Hyaline Cast: NONE SEEN /LPF
Ketones, ur: NEGATIVE
Nitrites, Initial: NEGATIVE
Protein, ur: NEGATIVE
RBC / HPF: NONE SEEN /HPF (ref 0–2)
Specific Gravity, Urine: 1.02 (ref 1.001–1.03)
pH: 7 (ref 5.0–8.0)

## 2018-06-11 LAB — URINE CULTURE
MICRO NUMBER:: 474477
Result:: NO GROWTH
SPECIMEN QUALITY:: ADEQUATE

## 2018-06-11 LAB — CULTURE INDICATED

## 2018-06-16 ENCOUNTER — Telehealth: Payer: Self-pay

## 2018-06-16 ENCOUNTER — Telehealth: Payer: Self-pay | Admitting: *Deleted

## 2018-06-16 DIAGNOSIS — H10403 Unspecified chronic conjunctivitis, bilateral: Secondary | ICD-10-CM | POA: Diagnosis not present

## 2018-06-16 MED ORDER — TERCONAZOLE 0.4 % VA CREA: 1.0000 | TOPICAL_CREAM | Freq: Every day | VAGINAL | 1 refills | Status: DC

## 2018-06-16 NOTE — Telephone Encounter (Signed)
Patient is calling requesting results from visit on 06/09/18. Please advise

## 2018-06-16 NOTE — Addendum Note (Signed)
Addended by: Keenan Bachelor on: 06/16/2018 03:12 PM   Modules accepted: Orders

## 2018-06-16 NOTE — Telephone Encounter (Signed)
I replied to patient in My Chart and copied and pasted Dr. Kristie Cowman reply and sent it to her.

## 2018-06-16 NOTE — Telephone Encounter (Signed)
Okay for Terazol 7 day prescription with 1 refill

## 2018-06-16 NOTE — Telephone Encounter (Signed)
Tell patient they did the initial screening but ultimately did not follow through with culture and sensitivity.  Not sure of the technical reason why.  In looking at the various medications that are screened I am not sure that I would use any of them to treat the yeast even if it was shown to be sensitive as they are very potent antifungals.  In reviewing resistant yeast literature the treatment regimens offered include Diflucan, prolonged Terazol and boric acid suppositories.  They really do not address other medications.  We could re-sample her if her symptoms read present to try to get them to culture and do a sensitivity if it will plate out.  Alternative would be to go back on the boric acid 600 mg suppositories daily and do that for 2 weeks straight to see if that does not get control of the symptoms.  We tried the 2-week in a row Terazol.  She does not tolerate the Diflucan.  Lastly we could refer to the vaginitis clinic at [facility] as they do have a clinic to deal with difficult vaginitis cases but I am not sure what they are backlogged is right now due to COVID.

## 2018-06-16 NOTE — Telephone Encounter (Signed)
Patient informed and Rx sent.  She would like referral to [facility] and I will send Victorino Dike a message about that referral request.

## 2018-06-16 NOTE — Telephone Encounter (Signed)
Patient received Dr. Velvet Bathe reply I sent her.  She said she had already tried the two weeks of Boric Acid and had burning problem. The Terazol relieved that.  She does not see any yeast on the outside now. She questions can she have one more Rx for Terazol to cover the holiday.  She thinks that this prolonged use of Terazol may be what she needs to keep this under control.

## 2018-06-16 NOTE — Telephone Encounter (Signed)
New encounter not needed.

## 2018-06-17 ENCOUNTER — Telehealth: Payer: Self-pay | Admitting: *Deleted

## 2018-06-17 ENCOUNTER — Encounter: Payer: Self-pay | Admitting: *Deleted

## 2018-06-17 NOTE — Telephone Encounter (Signed)
Per Dr. Velvet Bathe "Lastly we could refer to the vaginitis clinic at [facility] as they do have a clinic to deal with difficult vaginitis cases but I am not sure what they are backlogged is right now due to COVID."   I called Shelly Branch and was told to fax notes to [phone], office # [phone]  Sent patient my chart message with info above.

## 2018-06-17 NOTE — Telephone Encounter (Signed)
Dr.Fontaine I sent patient my chart message letting her know I faxed referral and she then sent this message.

## 2018-06-17 NOTE — Telephone Encounter (Signed)
When they culture urine they do not culture for the yeast but only culture for bacteria.  They just note that they saw yeast on visual inspection.  We did call the lab to follow-up when the results would be ready and at that point it seems that they were processing it but then when we finally called them and did not hear back from them they let us know that they did not ultimately culture it out for sensitivity.  I know it is confusing because it confused [place] to be honest but the bottom line is we ordered the test and they received it, the initial finding was yeast but then they never followed up with the antibiotic sensitivity and cultures.

## 2018-06-25 ENCOUNTER — Telehealth: Payer: Self-pay

## 2018-06-25 NOTE — Telephone Encounter (Signed)
TA-Plz see note below/pt has been trying to establish care with you since 2019?  She was made aware that she does not qualify as a NP but insisted that the message be sent to you as her friend Leitha Schuller is established with you and she wants to become your pt/plz advise/thx dmf

## 2018-06-25 NOTE — Telephone Encounter (Signed)
I am okay seeing her.

## 2018-06-25 NOTE — Telephone Encounter (Signed)
TB-Dr. Dayton Martes agrees to see this new pt/Can you please help with scheduling her? I thank you :) dmf

## 2018-06-25 NOTE — Telephone Encounter (Signed)
Copied from CRM [id number]. Topic: Appointment Scheduling - Scheduling Inquiry for Clinic >> Jun 25, 2018 11:17 AM Randol Kern wrote: Reason for CRM: Pt is interested in establishing primary care with Dr. Dayton Martes specifically. Her neighbor and friend Leitha Schuller is an established patient with Dr. Dayton Martes and highly recommends the pt est prim care with her. Pt made aware of current qualifications required to est primary care with Dr. Dayton Martes which she does not meet. However, pt insisted that message be sent over on her behalf to be wait listed for care with Dr. Dayton Martes. Pt has been trying to transfer care to Dr. Dayton Martes since 2019. Please advise  Best Contact: [phone]

## 2018-06-29 DIAGNOSIS — N949 Unspecified condition associated with female genital organs and menstrual cycle: Secondary | ICD-10-CM | POA: Diagnosis not present

## 2018-07-12 NOTE — Telephone Encounter (Signed)
Patient was seen on 06/29/18.

## 2018-07-14 ENCOUNTER — Encounter: Payer: Self-pay | Admitting: Gynecology

## 2018-07-19 DIAGNOSIS — N952 Postmenopausal atrophic vaginitis: Secondary | ICD-10-CM | POA: Diagnosis not present

## 2018-07-19 DIAGNOSIS — B373 Candidiasis of vulva and vagina: Secondary | ICD-10-CM | POA: Diagnosis not present

## 2018-07-19 DIAGNOSIS — N761 Subacute and chronic vaginitis: Secondary | ICD-10-CM | POA: Diagnosis not present

## 2018-08-20 DIAGNOSIS — Z6824 Body mass index (BMI) 24.0-24.9, adult: Secondary | ICD-10-CM | POA: Diagnosis not present

## 2018-08-20 DIAGNOSIS — N898 Other specified noninflammatory disorders of vagina: Secondary | ICD-10-CM | POA: Diagnosis not present

## 2018-09-22 DIAGNOSIS — H10403 Unspecified chronic conjunctivitis, bilateral: Secondary | ICD-10-CM | POA: Diagnosis not present

## 2018-10-20 DIAGNOSIS — M545 Low back pain: Secondary | ICD-10-CM | POA: Diagnosis not present

## 2018-10-20 DIAGNOSIS — M1990 Unspecified osteoarthritis, unspecified site: Secondary | ICD-10-CM | POA: Diagnosis not present

## 2018-10-20 DIAGNOSIS — Z13220 Encounter for screening for lipoid disorders: Secondary | ICD-10-CM | POA: Diagnosis not present

## 2018-10-20 DIAGNOSIS — Z8349 Family history of other endocrine, nutritional and metabolic diseases: Secondary | ICD-10-CM | POA: Diagnosis not present

## 2018-10-20 DIAGNOSIS — I10 Essential (primary) hypertension: Secondary | ICD-10-CM | POA: Diagnosis not present

## 2018-10-20 DIAGNOSIS — D649 Anemia, unspecified: Secondary | ICD-10-CM | POA: Diagnosis not present

## 2018-10-20 DIAGNOSIS — J309 Allergic rhinitis, unspecified: Secondary | ICD-10-CM | POA: Diagnosis not present

## 2018-10-20 DIAGNOSIS — Z0000 Encounter for general adult medical examination without abnormal findings: Secondary | ICD-10-CM | POA: Diagnosis not present

## 2018-10-26 ENCOUNTER — Encounter: Payer: Self-pay | Admitting: Gynecology

## 2018-11-26 NOTE — Telephone Encounter (Signed)
Pt calling back not to schedule NP with Dr Deborra Medina

## 2018-11-30 NOTE — Telephone Encounter (Signed)
No unfortunately I am leaving in less than 3 months so she needs to establish with another provider.  That's what is best for the patient.

## 2018-11-30 NOTE — Telephone Encounter (Signed)
TA-Are you still ok with seeing this pt? Plz advise/thx dmf

## 2018-12-02 NOTE — Telephone Encounter (Signed)
TB-Dr. Deborra Branch said she needs to establish with a different provider since she is leaving in less than 3 months/thx dmf

## 2018-12-21 NOTE — Telephone Encounter (Signed)
LMOV for patient to call office to schedule appointment with another provider due to dr. Deborra Medina is leaving practice.

## 2019-01-19 DIAGNOSIS — Z6824 Body mass index (BMI) 24.0-24.9, adult: Secondary | ICD-10-CM | POA: Diagnosis not present

## 2019-01-19 DIAGNOSIS — N898 Other specified noninflammatory disorders of vagina: Secondary | ICD-10-CM | POA: Diagnosis not present

## 2019-02-16 DIAGNOSIS — H5203 Hypermetropia, bilateral: Secondary | ICD-10-CM | POA: Diagnosis not present

## 2019-02-16 DIAGNOSIS — H2513 Age-related nuclear cataract, bilateral: Secondary | ICD-10-CM | POA: Diagnosis not present

## 2019-02-17 ENCOUNTER — Encounter: Payer: BLUE CROSS/BLUE SHIELD | Admitting: Obstetrics and Gynecology

## 2019-02-17 DIAGNOSIS — Z01419 Encounter for gynecological examination (general) (routine) without abnormal findings: Secondary | ICD-10-CM | POA: Diagnosis not present

## 2019-02-17 DIAGNOSIS — Z6824 Body mass index (BMI) 24.0-24.9, adult: Secondary | ICD-10-CM | POA: Diagnosis not present

## 2019-02-18 DIAGNOSIS — H43811 Vitreous degeneration, right eye: Secondary | ICD-10-CM | POA: Diagnosis not present

## 2019-04-15 DIAGNOSIS — K649 Unspecified hemorrhoids: Secondary | ICD-10-CM | POA: Diagnosis not present

## 2019-04-15 DIAGNOSIS — N907 Vulvar cyst: Secondary | ICD-10-CM | POA: Diagnosis not present

## 2019-04-26 DIAGNOSIS — H43811 Vitreous degeneration, right eye: Secondary | ICD-10-CM | POA: Diagnosis not present

## 2019-05-23 ENCOUNTER — Other Ambulatory Visit: Payer: Self-pay | Admitting: Obstetrics and Gynecology

## 2019-05-23 DIAGNOSIS — Z1231 Encounter for screening mammogram for malignant neoplasm of breast: Secondary | ICD-10-CM

## 2019-06-03 ENCOUNTER — Other Ambulatory Visit: Payer: Self-pay

## 2019-06-03 ENCOUNTER — Ambulatory Visit
Admission: RE | Admit: 2019-06-03 | Discharge: 2019-06-03 | Disposition: A | Discharge status: Home / Self Care | Payer: BLUE CROSS/BLUE SHIELD | Source: Ambulatory Visit | Attending: Obstetrics and Gynecology | Admitting: Obstetrics and Gynecology

## 2019-06-03 ENCOUNTER — Other Ambulatory Visit: Payer: Self-pay | Admitting: Obstetrics and Gynecology

## 2019-06-03 DIAGNOSIS — Z1231 Encounter for screening mammogram for malignant neoplasm of breast: Secondary | ICD-10-CM

## 2019-06-09 DIAGNOSIS — L821 Other seborrheic keratosis: Secondary | ICD-10-CM | POA: Diagnosis not present

## 2019-06-09 DIAGNOSIS — L565 Disseminated superficial actinic porokeratosis (DSAP): Secondary | ICD-10-CM | POA: Diagnosis not present

## 2019-06-09 DIAGNOSIS — L280 Lichen simplex chronicus: Secondary | ICD-10-CM | POA: Diagnosis not present

## 2019-06-09 DIAGNOSIS — B078 Other viral warts: Secondary | ICD-10-CM | POA: Diagnosis not present

## 2019-06-09 DIAGNOSIS — D485 Neoplasm of uncertain behavior of skin: Secondary | ICD-10-CM | POA: Diagnosis not present

## 2019-06-09 DIAGNOSIS — D1801 Hemangioma of skin and subcutaneous tissue: Secondary | ICD-10-CM | POA: Diagnosis not present

## 2019-07-06 DIAGNOSIS — H43813 Vitreous degeneration, bilateral: Secondary | ICD-10-CM | POA: Diagnosis not present

## 2019-07-07 DIAGNOSIS — N760 Acute vaginitis: Secondary | ICD-10-CM | POA: Diagnosis not present

## 2019-08-03 DIAGNOSIS — N760 Acute vaginitis: Secondary | ICD-10-CM | POA: Diagnosis not present

## 2019-09-02 DIAGNOSIS — N761 Subacute and chronic vaginitis: Secondary | ICD-10-CM | POA: Diagnosis not present

## 2019-10-11 DIAGNOSIS — N761 Subacute and chronic vaginitis: Secondary | ICD-10-CM | POA: Diagnosis not present

## 2019-10-26 DIAGNOSIS — Z8349 Family history of other endocrine, nutritional and metabolic diseases: Secondary | ICD-10-CM | POA: Diagnosis not present

## 2019-10-26 DIAGNOSIS — J309 Allergic rhinitis, unspecified: Secondary | ICD-10-CM | POA: Diagnosis not present

## 2019-10-26 DIAGNOSIS — I10 Essential (primary) hypertension: Secondary | ICD-10-CM | POA: Diagnosis not present

## 2019-10-26 DIAGNOSIS — Z0000 Encounter for general adult medical examination without abnormal findings: Secondary | ICD-10-CM | POA: Diagnosis not present

## 2019-10-26 DIAGNOSIS — D649 Anemia, unspecified: Secondary | ICD-10-CM | POA: Diagnosis not present

## 2019-10-26 DIAGNOSIS — Z13220 Encounter for screening for lipoid disorders: Secondary | ICD-10-CM | POA: Diagnosis not present

## 2019-10-26 DIAGNOSIS — Z8262 Family history of osteoporosis: Secondary | ICD-10-CM | POA: Diagnosis not present

## 2019-10-26 DIAGNOSIS — M1990 Unspecified osteoarthritis, unspecified site: Secondary | ICD-10-CM | POA: Diagnosis not present

## 2019-11-02 ENCOUNTER — Other Ambulatory Visit: Payer: Self-pay | Admitting: Family Medicine

## 2019-11-02 DIAGNOSIS — M8580 Other specified disorders of bone density and structure, unspecified site: Secondary | ICD-10-CM

## 2019-11-08 DIAGNOSIS — N761 Subacute and chronic vaginitis: Secondary | ICD-10-CM | POA: Diagnosis not present

## 2020-01-02 DIAGNOSIS — N761 Subacute and chronic vaginitis: Secondary | ICD-10-CM | POA: Diagnosis not present

## 2020-02-21 DIAGNOSIS — Z01419 Encounter for gynecological examination (general) (routine) without abnormal findings: Secondary | ICD-10-CM | POA: Diagnosis not present

## 2020-02-22 DIAGNOSIS — H2513 Age-related nuclear cataract, bilateral: Secondary | ICD-10-CM | POA: Diagnosis not present

## 2020-02-22 DIAGNOSIS — H5203 Hypermetropia, bilateral: Secondary | ICD-10-CM | POA: Diagnosis not present

## 2020-02-22 DIAGNOSIS — H43811 Vitreous degeneration, right eye: Secondary | ICD-10-CM | POA: Diagnosis not present

## 2020-05-17 IMAGING — CT CT ABD-PELV W/ CM
2 of 5 series · 12 of 46 positions shown, 14 images · IV contrast (iopamidol)
Comparison: None.

CLINICAL DATA: History of diverticulitis

EXAM:
CT ABDOMEN AND PELVIS WITH CONTRAST
TECHNIQUE: Multidetector CT imaging of the abdomen and pelvis was performed
using the standard protocol following bolus administration of
intravenous contrast.
CONTRAST:  100mL O5NWP1-LAA IOPAMIDOL (O5NWP1-LAA) INJECTION 61%,
additional oral enteric contrast

[Series 2: abd pelvis 5.00 br40 s3 ax · axial · 0.55mm/px · z∈[+1273,+1628]mm · 9 of 81 slices shown, 11 images]
[im 5/81  soft-tissue]
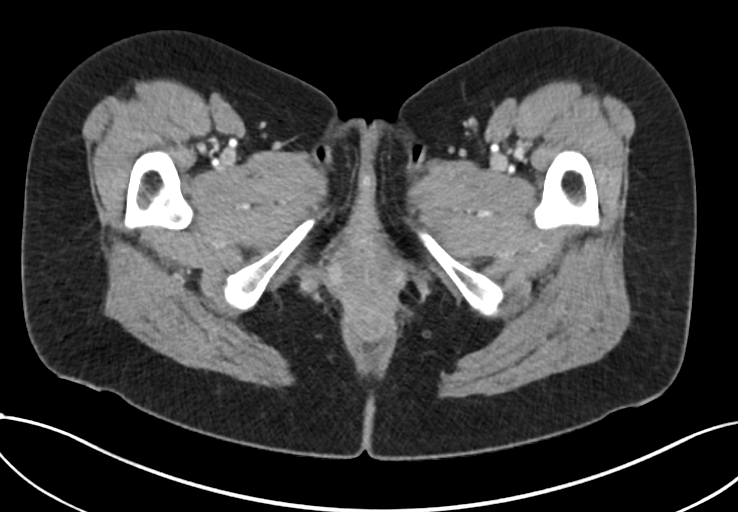
[im 5/81  bone]
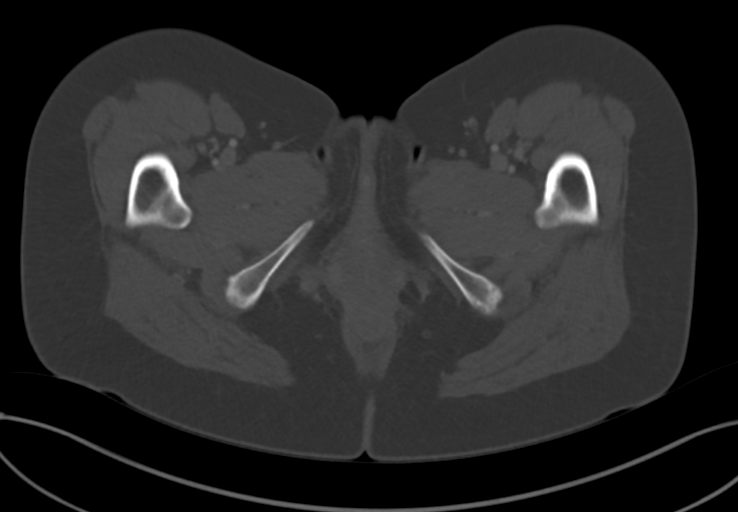
[im 14/81  soft-tissue]
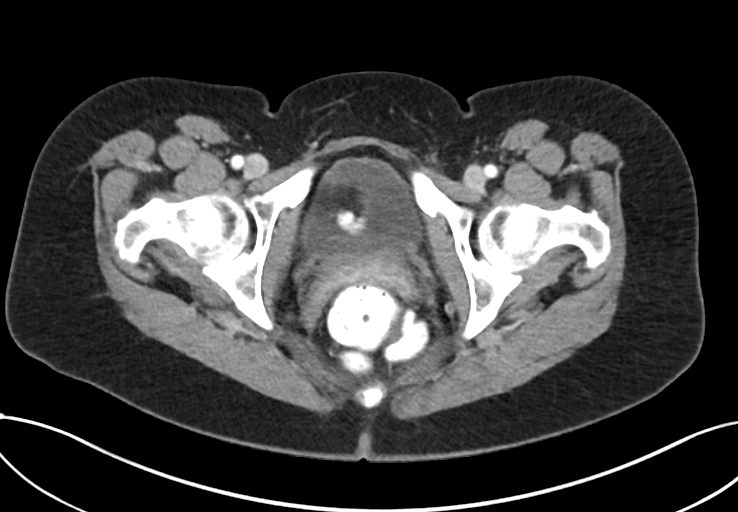
[im 23/81  soft-tissue]
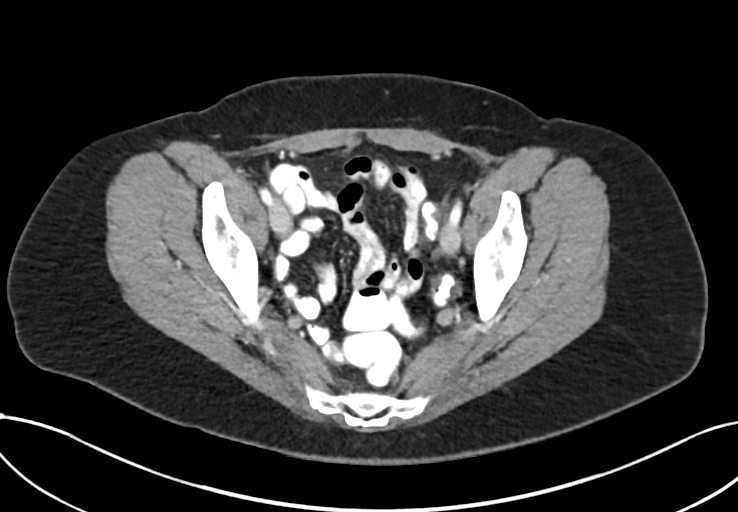
[im 32/81  soft-tissue]
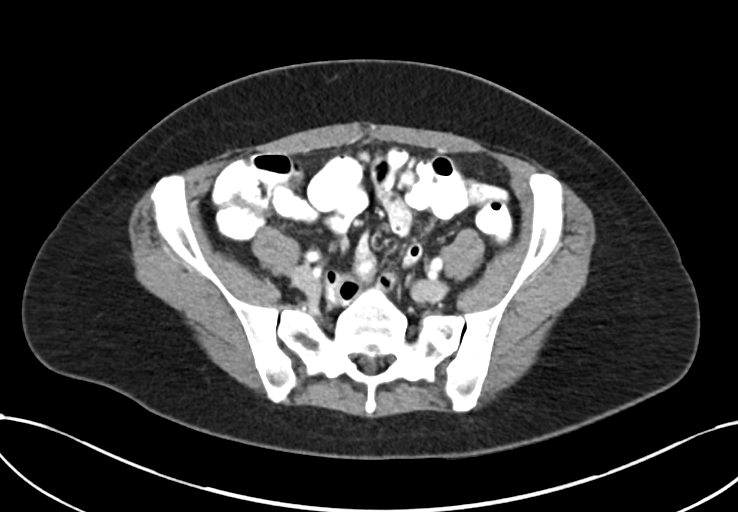
[im 41/81  soft-tissue]
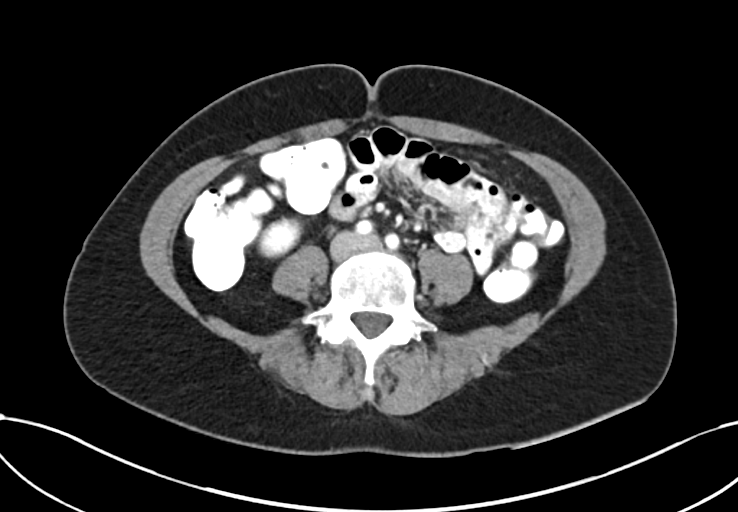
[im 49/81  soft-tissue]
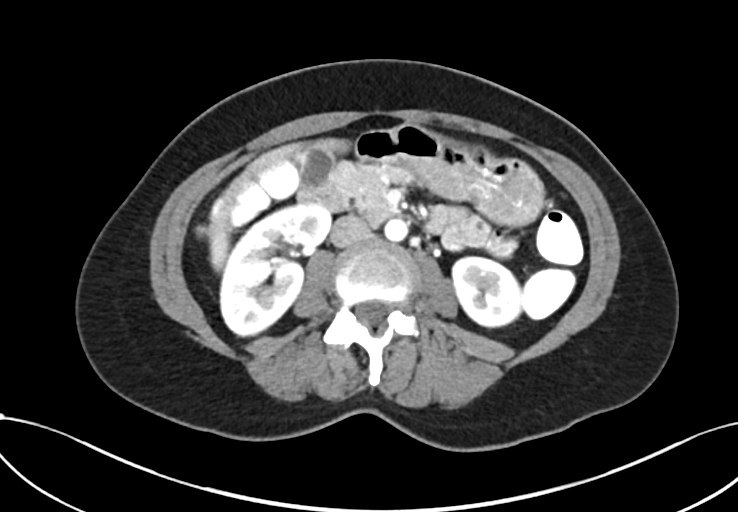
[im 58/81  soft-tissue]
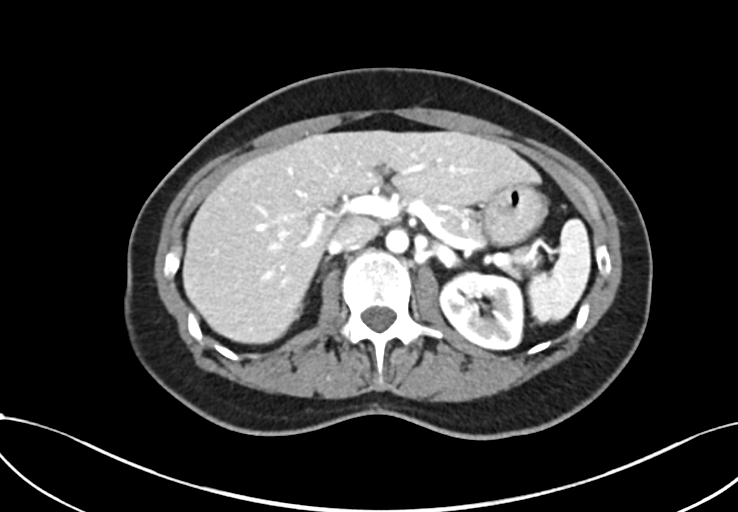
[im 67/81  soft-tissue]
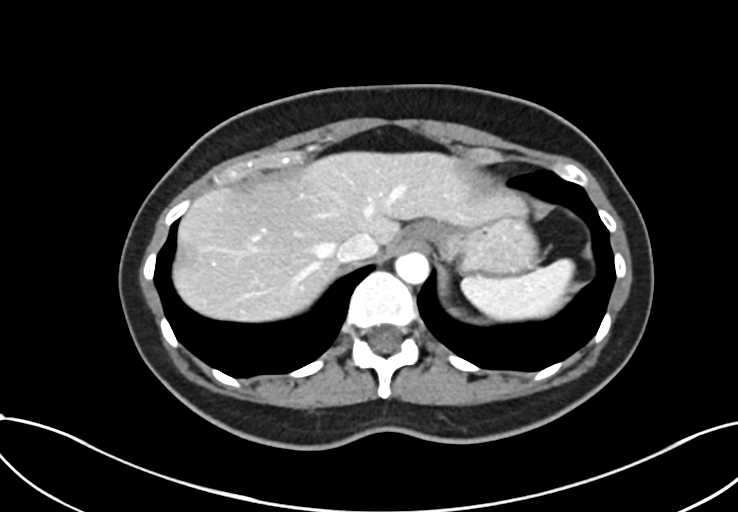
[im 76/81  soft-tissue]
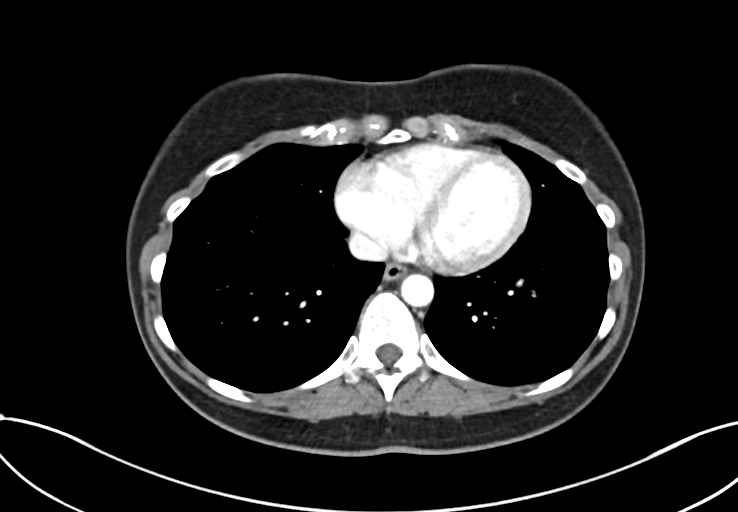
[im 76/81  bone]
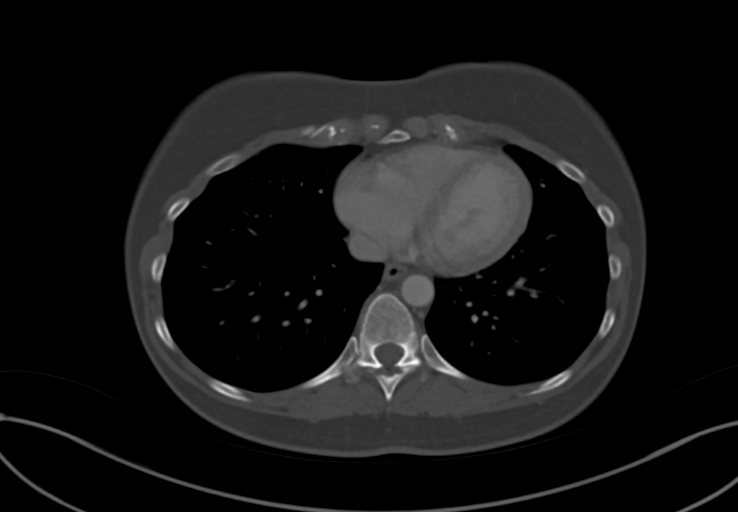

[Series 6: abd pelvis 2.00 br40 s3 cor · coronal · 0.76mm/px · 3 of 128 slices shown]
[im 43/128  soft-tissue]
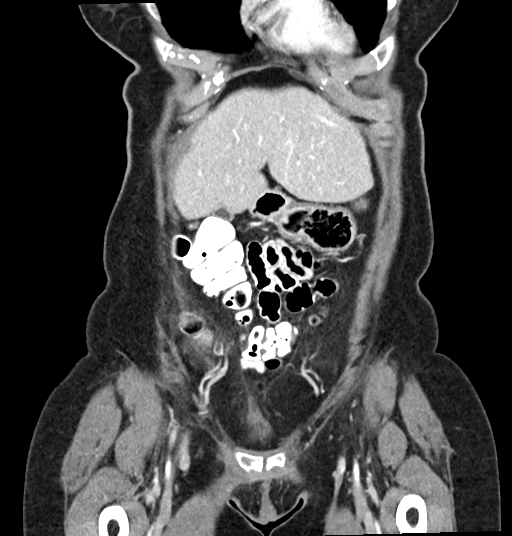
[im 57/128  soft-tissue]
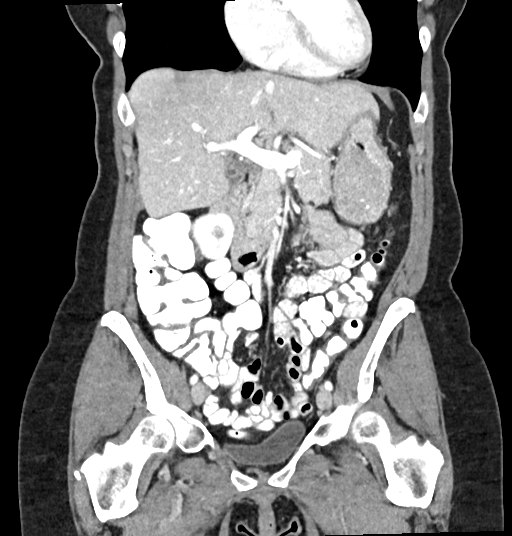
[im 71/128  soft-tissue]
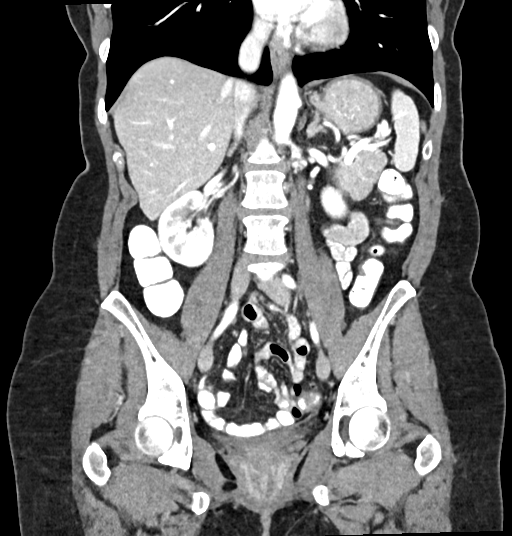

[12 of 46 positions shown; findings below may reference images not displayed]

FINDINGS: Lower chest: No acute abnormality.

Hepatobiliary: No focal liver abnormality is seen. No gallstones,
gallbladder wall thickening, or biliary dilatation.

Pancreas: Unremarkable. No pancreatic ductal dilatation or
surrounding inflammatory changes.

Spleen: Normal in size without focal abnormality.

Adrenals/Urinary Tract: Adrenal glands are unremarkable. Kidneys are
normal, without renal calculi, focal lesion, or hydronephrosis.
Bladder is unremarkable.

Stomach/Bowel: Stomach is within normal limits. Appendix is not
clearly visualized and may be surgically absent. No evidence of
bowel wall thickening, distention, or inflammatory changes.

Vascular/Lymphatic: No significant vascular findings are present. No
enlarged abdominal or pelvic lymph nodes.

Reproductive: No mass or other abnormality.

Other: No abdominal wall hernia or abnormality. No abdominopelvic
ascites.

Musculoskeletal: No acute or significant osseous findings.
IMPRESSION: No significant CT abnormality of the abdomen or pelvis. No evidence
of diverticulitis or significant diverticulosis.

## 2020-06-12 DIAGNOSIS — K649 Unspecified hemorrhoids: Secondary | ICD-10-CM | POA: Diagnosis not present

## 2020-08-06 DIAGNOSIS — G43909 Migraine, unspecified, not intractable, without status migrainosus: Secondary | ICD-10-CM | POA: Diagnosis not present

## 2020-08-30 DIAGNOSIS — Z8371 Family history of colonic polyps: Secondary | ICD-10-CM | POA: Diagnosis not present

## 2020-08-30 DIAGNOSIS — Z1211 Encounter for screening for malignant neoplasm of colon: Secondary | ICD-10-CM | POA: Diagnosis not present

## 2020-08-30 DIAGNOSIS — K5730 Diverticulosis of large intestine without perforation or abscess without bleeding: Secondary | ICD-10-CM | POA: Diagnosis not present

## 2020-09-14 DIAGNOSIS — J309 Allergic rhinitis, unspecified: Secondary | ICD-10-CM | POA: Diagnosis not present

## 2020-09-14 DIAGNOSIS — N959 Unspecified menopausal and perimenopausal disorder: Secondary | ICD-10-CM | POA: Diagnosis not present

## 2020-09-14 DIAGNOSIS — I10 Essential (primary) hypertension: Secondary | ICD-10-CM | POA: Diagnosis not present

## 2020-09-14 DIAGNOSIS — Z0000 Encounter for general adult medical examination without abnormal findings: Secondary | ICD-10-CM | POA: Diagnosis not present

## 2020-09-14 DIAGNOSIS — D649 Anemia, unspecified: Secondary | ICD-10-CM | POA: Diagnosis not present

## 2020-09-18 DIAGNOSIS — D649 Anemia, unspecified: Secondary | ICD-10-CM | POA: Diagnosis not present

## 2020-09-21 ENCOUNTER — Other Ambulatory Visit: Payer: Self-pay | Admitting: Family Medicine

## 2020-09-21 DIAGNOSIS — Z1231 Encounter for screening mammogram for malignant neoplasm of breast: Secondary | ICD-10-CM

## 2020-09-21 DIAGNOSIS — M8580 Other specified disorders of bone density and structure, unspecified site: Secondary | ICD-10-CM

## 2020-11-12 ENCOUNTER — Ambulatory Visit: Payer: BC Managed Care – PPO

## 2020-11-30 ENCOUNTER — Other Ambulatory Visit: Payer: Self-pay

## 2020-11-30 ENCOUNTER — Ambulatory Visit
Admission: RE | Admit: 2020-11-30 | Discharge: 2020-11-30 | Disposition: A | Discharge status: Home / Self Care | Payer: Medicare Other | Source: Ambulatory Visit | Attending: Family Medicine | Admitting: Family Medicine

## 2020-11-30 DIAGNOSIS — Z1231 Encounter for screening mammogram for malignant neoplasm of breast: Secondary | ICD-10-CM

## 2021-03-13 ENCOUNTER — Other Ambulatory Visit: Payer: BC Managed Care – PPO

## 2021-07-12 ENCOUNTER — Other Ambulatory Visit: Payer: Self-pay | Admitting: Family Medicine

## 2021-07-12 DIAGNOSIS — M8580 Other specified disorders of bone density and structure, unspecified site: Secondary | ICD-10-CM

## 2021-07-15 ENCOUNTER — Other Ambulatory Visit: Payer: Self-pay | Admitting: Family Medicine

## 2021-07-15 DIAGNOSIS — Z1231 Encounter for screening mammogram for malignant neoplasm of breast: Secondary | ICD-10-CM

## 2021-12-16 ENCOUNTER — Ambulatory Visit: Payer: Medicare Other

## 2021-12-18 ENCOUNTER — Encounter: Payer: Self-pay | Admitting: Internal Medicine

## 2021-12-18 DIAGNOSIS — R600 Localized edema: Secondary | ICD-10-CM

## 2021-12-25 ENCOUNTER — Ambulatory Visit
Admission: RE | Admit: 2021-12-25 | Discharge: 2021-12-25 | Disposition: A | Discharge status: Home / Self Care | Payer: Medicare Other | Source: Ambulatory Visit | Attending: Family Medicine | Admitting: Family Medicine

## 2021-12-25 DIAGNOSIS — M8580 Other specified disorders of bone density and structure, unspecified site: Secondary | ICD-10-CM

## 2022-01-02 ENCOUNTER — Other Ambulatory Visit: Payer: Self-pay

## 2022-01-02 DIAGNOSIS — M79605 Pain in left leg: Secondary | ICD-10-CM

## 2022-01-03 ENCOUNTER — Ambulatory Visit (INDEPENDENT_AMBULATORY_CARE_PROVIDER_SITE_OTHER): Payer: Medicare Other | Admitting: Vascular Surgery

## 2022-01-03 ENCOUNTER — Ambulatory Visit (HOSPITAL_COMMUNITY)
Admission: RE | Admit: 2022-01-03 | Discharge: 2022-01-03 | Disposition: A | Discharge status: Home / Self Care | Payer: Medicare Other | Source: Ambulatory Visit | Attending: Vascular Surgery | Admitting: Vascular Surgery

## 2022-01-03 ENCOUNTER — Encounter: Payer: Self-pay | Admitting: Vascular Surgery

## 2022-01-03 VITALS — BP 152/88 | HR 84 | Temp 98.2°F | Resp 20 | Ht 59.0 in | Wt 125.0 lb

## 2022-01-03 DIAGNOSIS — M79604 Pain in right leg: Secondary | ICD-10-CM | POA: Insufficient documentation

## 2022-01-03 DIAGNOSIS — M79605 Pain in left leg: Secondary | ICD-10-CM

## 2022-01-03 DIAGNOSIS — M7989 Other specified soft tissue disorders: Secondary | ICD-10-CM | POA: Diagnosis not present

## 2022-01-03 NOTE — Progress Notes (Signed)
Office Note     CC: Lateral lower extremity waxing and waning leg swelling Requesting Provider:  Ollen Bowl, MD  HPI: Shelly Branch is a 66 y.o. ([birth year]) female presenting at the request of .Hughie Closs, MD for bilateral lower extremity waxing waning swelling.  Shelly Branch first appreciated the swelling roughly 3 years ago, asymptomatic.  Symptoms recently worsened with the administration of Celebrex for her degenerative disc disease, spondylosis.  After stopping Celebrex, the lower extremity edema improved dramatically, however she still feels as though her legs been swollen for 3 years.  Has a history significant for back pain.  She has seen a physical therapist for a number of years, stating that the pain that radiates to lower extremities improves with physical therapy.  Also mentioned she has had a false negative lupus, and is concerned about lupus due to a relative being diagnosed.  Furthermore, she has appreciated frequent urination and is unsure of the etiology.  Today, Shelly Branch was doing well, accompanied by her husband.  She continues to live an active, independent lifestyle, but has pain and swelling in the lower extremities slows her down.  Denies symptoms of claudication, but states that she has numbness that affects bilateral calves when sitting, and after walking for a significant mount time has pain that radiates in the bottom of the left foot.  The pt is not on a statin for cholesterol management.  The pt is not on a daily aspirin.   Other AC:  - The pt is not on medication for hypertension.   The pt is not diabetic.  Tobacco hx:  -  Past Medical History:  Diagnosis Date   Cervical dysplasia    25 yrs ago cryo   Eye infection    Hypertension    Osteopenia 02/2015   T score -1.8 FRAX 8.5%/0.9% stable from prior DEXA 2014   Rosacea     Past Surgical History:  Procedure Laterality Date   ABDOMINAL HYSTERECTOMY  1991   TAH BSO endometriosis   APPENDECTOMY     EYE  SURGERY     Strabismus   MANDIBLE SURGERY     PELVIC LAPAROSCOPY      Social History   Socioeconomic History   Marital status: Married    Spouse name: Not on file   Number of children: Not on file   Years of education: Not on file   Highest education level: Not on file  Occupational History   Not on file  Tobacco Use   Smoking status: Never   Smokeless tobacco: Never  Vaping Use   Vaping Use: Never used  Substance and Sexual Activity   Alcohol use: Yes    Alcohol/week: 4.0 standard drinks of alcohol    Types: 4 Standard drinks or equivalent per week   Drug use: No   Sexual activity: Not Currently    Comment: 1st intercourse 23 yr.-Fewer than 5 partners  Other Topics Concern   Not on file  Social History Narrative   Not on file   Social Determinants of Health   Financial Resource Strain: Not on file  Food Insecurity: Not on file  Transportation Needs: Not on file  Physical Activity: Not on file  Stress: Not on file  Social Connections: Not on file  Intimate Partner Violence: Not on file   Family History  Problem Relation Age of Onset   Cancer Mother        Lung   Diabetes Father    Hypertension Father  Cancer Father        Prostate   Cancer Brother        Prostate   Breast cancer Paternal Grandmother 11    Current Outpatient Medications  Medication Sig Dispense Refill   Calcium Carb-Cholecalciferol (CALCIUM 1000 + D PO) Take by mouth.     conjugated estrogens (PREMARIN) vaginal cream Place 1 applicator vaginally 2 (two) times a week.     estradiol (VIVELLE-DOT) 0.05 MG/24HR patch PLACE 1 PATCH (0.05 MG TOTAL) ONTO THE SKIN 2 (TWO) TIMES A WEEK. 24 patch 3   fluticasone (FLONASE) 50 MCG/ACT nasal spray Place into both nostrils daily.     hydrocortisone (ANUSOL-HC) 25 MG suppository 25 mg 2 (two) times a week. Used vaginally     Multiple Vitamin (MULTIVITAMIN) capsule Take 1 capsule by mouth daily.     telmisartan (MICARDIS) 40 MG tablet Take 40 mg by  mouth daily.     No current facility-administered medications for this visit.    Allergies  Allergen Reactions   Amoxicillin     REACTION: cant tolerate   Cefuroxime Axetil     REACTION: probable hives   Epinephrine     REACTION: for dental   Iodinated Contrast Media Other (See Comments)   Tetanus Toxoid    Erythromycin Nausea And Vomiting   Sulfa Antibiotics Rash     REVIEW OF SYSTEMS:  [X]  denotes positive finding, [ ]  denotes negative finding Cardiac  Comments:  Chest pain or chest pressure:    Shortness of breath upon exertion:    Short of breath when lying flat:    Irregular heart rhythm:        Vascular    Pain in calf, thigh, or hip brought on by ambulation:    Pain in feet at night that wakes you up from your sleep:     Blood clot in your veins:    Leg swelling:         Pulmonary    Oxygen at home:    Productive cough:     Wheezing:         Neurologic    Sudden weakness in arms or legs:     Sudden numbness in arms or legs:     Sudden onset of difficulty speaking or slurred speech:    Temporary loss of vision in one eye:     Problems with dizziness:         Gastrointestinal    Blood in stool:     Vomited blood:         Genitourinary    Burning when urinating:     Blood in urine:        Psychiatric    Major depression:         Hematologic    Bleeding problems:    Problems with blood clotting too easily:        Skin    Rashes or ulcers:        Constitutional    Fever or chills:      PHYSICAL EXAMINATION:  Vitals:   01/03/22 0834  BP: (!) 152/88  Pulse: 84  Resp: 20  Temp: 98.2 F (36.8 C)  SpO2: 99%  Weight: 125 lb (56.7 kg)  Height: 4\' 11"  (1.499 m)    General:  WDWN in NAD; vital signs documented above Gait: Not observed HENT: WNL, normocephalic Pulmonary: normal non-labored breathing , without wheezing Cardiac: regular HR, Abdomen: soft, NT, no masses Skin: with rashes Vascular Exam/Pulses:  Right Left  Radial 2+  (normal) 2+ (normal)  Ulnar    Femoral    Popliteal    DP 2+ (normal) 2+ (normal)  PT 2+ (normal) 2+ (normal)   Extremities: without ischemic changes, without Gangrene , without cellulitis; without open wounds;  Musculoskeletal: no muscle wasting or atrophy  Neurologic: A&O X 3;  No focal weakness or paresthesias are detected Psychiatric:  The pt has Normal affect.   Non-Invasive Vascular Imaging:   Normal ABI bilaterally, depressed toe pressure on the right.      ASSESSMENT/PLAN: Shelly Branch is a 66 y.o. female presenting with waxing waning bilateral lower extremity swelling which has been present for a number of years.  Most recently, the swelling was appreciated after use of Celebrex.  Fortunately the edema resolved back to the 3- year baseline.  ABI was reviewed demonstrating relatively normal perfusion bilaterally. [facility] which noted no DVT, absent reflux bilaterally. On exam, she had a palpable pulse in the foot with no appreciable varicose veins, telangiectasias.  I do not have a good etiology as to the reason for Shelly Branch's bilateral lower extremity edema.  I am happy that this resolved, at least to the level that it has been for the last three years, with the cessation of Celebrex.  She does not have tense or symptoms consistent with lymphedema or imaging concerning for chronic venous insufficiency.  I do not have an indication, we discussed wearing compression stockings to alleviate some of the swelling should it return. Shelly Branch and I also discussed some of her other concerns including urinary frequency, false positive lupus testing. I asked that she discuss these with Dr. Jacqulyn Bath.  I also asked that she return to physical therapy as this has improved ambulation previously.   Shelly Sparrow, MD [facility] [phone] Total time of patient care including pre-visit research, consultation, and  documentation greater than 45 minutes

## 2022-03-17 ENCOUNTER — Ambulatory Visit
Admission: RE | Admit: 2022-03-17 | Discharge: 2022-03-17 | Disposition: A | Discharge status: Home / Self Care | Payer: Medicare Other | Source: Ambulatory Visit | Attending: Family Medicine | Admitting: Family Medicine

## 2022-03-17 DIAGNOSIS — Z1231 Encounter for screening mammogram for malignant neoplasm of breast: Secondary | ICD-10-CM

## 2022-04-22 ENCOUNTER — Encounter: Payer: Self-pay | Admitting: Physical Medicine and Rehabilitation

## 2022-05-23 ENCOUNTER — Encounter
Payer: Medicare Other | Attending: Physical Medicine and Rehabilitation | Admitting: Physical Medicine and Rehabilitation

## 2022-05-23 ENCOUNTER — Encounter: Payer: Self-pay | Admitting: Physical Medicine and Rehabilitation

## 2022-05-23 VITALS — BP 133/83 | Ht 59.0 in | Wt 125.0 lb

## 2022-05-23 DIAGNOSIS — E559 Vitamin D deficiency, unspecified: Secondary | ICD-10-CM | POA: Diagnosis present

## 2022-05-23 DIAGNOSIS — M549 Dorsalgia, unspecified: Secondary | ICD-10-CM | POA: Diagnosis present

## 2022-05-23 DIAGNOSIS — R600 Localized edema: Secondary | ICD-10-CM

## 2022-05-23 DIAGNOSIS — M7989 Other specified soft tissue disorders: Secondary | ICD-10-CM | POA: Diagnosis present
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
## 2022-05-23 NOTE — Patient Instructions (Signed)

## 2022-05-24 LAB — BRAIN NATRIURETIC PEPTIDE: BNP: 26.1 pg/mL (ref 0.0–100.0)

## 2022-05-24 LAB — VITAMIN D 25 HYDROXY (VIT D DEFICIENCY, FRACTURES): Vit D, 25-Hydroxy: 48.2 ng/mL (ref 30.0–100.0)

## 2022-05-29 ENCOUNTER — Encounter: Payer: Medicare Other | Admitting: Physical Medicine and Rehabilitation

## 2022-05-29 ENCOUNTER — Encounter
Payer: Medicare Other | Attending: Physical Medicine and Rehabilitation | Admitting: Physical Medicine and Rehabilitation

## 2022-05-29 DIAGNOSIS — M549 Dorsalgia, unspecified: Secondary | ICD-10-CM

## 2022-05-29 NOTE — Progress Notes (Signed)
Subjective:    Patient ID: Shelly Branch, female    DOB: March 08, 1955, 67 y.o.   MRN: [id number]  HPI  Shelly Branch is a 67 year old woman who presents for follow-up of cervical radiculitis.  1) Spinal pain, back and neck -she is not sure if there is correlation between nerve pain and swelling.  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 General activity 0 Relation with others 0 Enjoyment of life 0 What TIME of day is your pain at its worst? varies Sleep (in general)  Fair to Good  Pain is worse with: walking, sitting, and standing Pain improves with: therapy/exercise Relief from Meds: 0  Do you have any goals in this area?  yes  retired  numbness tingling spasms  Any changes since last visit?  no  Any changes since last visit?  no    Family History  Problem Relation Age of Onset   Cancer Mother        Lung   Diabetes Father    Hypertension Father    Cancer Father        Prostate   Cancer Brother        Prostate   Breast cancer Paternal Grandmother 21   Social History   Socioeconomic History   Marital status: Married    Spouse name: Not on file   Number of children: Not on file   Years of education: Not on file   Highest education level: Not on file  Occupational History   Not on file  Tobacco Use   Smoking status: Never   Smokeless tobacco: Never  Vaping Use   Vaping Use: Never used  Substance and Sexual Activity    Alcohol use: Yes    Alcohol/week: 4.0 standard drinks of alcohol    Types: 4 Standard drinks or equivalent per week   Drug use: No   Sexual activity: Not Currently    Comment: 1st intercourse 23 yr.-Fewer than 5 partners  Other Topics Concern   Not on file  Social History Narrative   Not on file   Social Determinants of Health   Financial Resource Strain: Not on file  Food Insecurity: Not on file  Transportation Needs: Not on file  Physical Activity: Not on file  Stress: Not on file  Social Connections: Not on file   Past Surgical History:  Procedure Laterality Date   ABDOMINAL HYSTERECTOMY  1991   TAH BSO endometriosis   APPENDECTOMY     EYE SURGERY     Strabismus   MANDIBLE SURGERY     PELVIC LAPAROSCOPY     Past Medical History:  Diagnosis Date   Cervical dysplasia    25 yrs ago cryo   Eye infection    Hypertension    Osteopenia 02/2015   T score -1.8 FRAX 8.5%/0.9% stable from prior DEXA 2014   Rosacea    There were no vitals  taken for this visit.  Opioid Risk Score:   Fall Risk Score:  `1  Depression screen [facility] 2/9     05/23/2022   11:20 AM  Depression screen [facility] 2/9  Decreased Interest 0  Down, Depressed, Hopeless 0  PHQ - 2 Score 0  Altered sleeping 1  Tired, decreased energy 0  Change in appetite 0  Feeling bad or failure about yourself  0  Trouble concentrating 0  Moving slowly or fidgety/restless 0  Suicidal thoughts 0  PHQ-9 Score 1  Difficult doing work/chores Not difficult at all      Review of Systems  Musculoskeletal:  Positive for back pain and gait problem.       B/L shoulder pain B/L arm pain  B/L foot pain   All other systems reviewed and are negative.      Objective:   Physical Exam Not performed      Assessment & Plan:  1) Chronic Pain Syndrome secondary to cervical spine stenosis -Discussed current symptoms of pain and history of pain.  -continue PT -Discussed Qutenza as an option for neuropathic pain control.  Discussed that this is a capsaicin patch, stronger than capsaicin cream. Discussed that it is currently approved for diabetic peripheral neuropathy and post-herpetic neuralgia, but that it has also shown benefit in treating other forms of neuropathy. Provided patient with link to site to learn more about the patch: https://www.clark.biz/. Discussed that the patch would be placed in office and benefits usually last 3 months. Discussed that unintended exposure to capsaicin can cause severe irritation of eyes, mucous membranes, respiratory tract, and skin, but that Qutenza is a local treatment and does not have the systemic side effects of other nerve medications. Discussed that there may be pain, itching, erythema, and decreased sensory function associated with the application of Qutenza. Side effects usually subside within 1 week. A cold pack of analgesic medications can help with these side effects. Blood pressure can also be increased due to pain associated with administration of the patch.   -discussed the differences between qutenza patch and topical capsaicin  -discussed mechanism of action of low dose naltrexone as an opioid receptor antagonist which stimulates your body's production of its own natural endogenous opioids, helping to decrease pain. Discussed that it can also decrease T cell response and thus be helpful in decreasing inflammation, and symptoms of brain fog, fatigue, anxiety, depression, and allergies. Discussed that this medication needs to be compounded at a compounding pharmacy and can more expensive. Discussed that I usually start at 1mg  and if this is not providing enough relief then I titrate upward on a monthly basis.    -Discussed benefits of exercise in reducing pain. -discussed that pain goes to all fingers -Discussed following foods that may reduce pain: 1) Ginger (especially studied for arthritis)- reduce leukotriene production to decrease inflammation 2) Blueberries- high  in phytonutrients that decrease inflammation 3) Salmon- marine omega-3s reduce joint swelling and pain 4) Pumpkin seeds- reduce inflammation 5) dark chocolate- reduces inflammation 6) turmeric- reduces inflammation 7) tart cherries - reduce pain and stiffness 8) extra virgin olive oil - its compound olecanthal helps to block prostaglandins  9) chili peppers- can be eaten or applied topically via capsaicin 10) mint- helpful for headache, muscle aches, joint pain, and itching 11) garlic- reduces inflammation  Link to further information on diet for chronic pain: http://www.bray.com/    2) Low back pain -Provided with a pain relief journal and discussed that it contains foods and lifestyle  tips to naturally help to improve pain. Discussed that these lifestyle strategies are also very good for health unlike some medications which can have negative side effects. Discussed that the act of keeping a journal can be therapeutic and helpful to realize patterns what helps to trigger and alleviate pain.   Continue PT  3) Cervical myofascial pain: -discussed trigger poin injections  10 minutes spent in discussion of low dose naltrexone, the differences between qutenza and topical capsaicin, the risks and benefits of topamax versus gabapentin, review of her vitamin D and BNP levels

## 2022-06-20 ENCOUNTER — Encounter: Payer: Self-pay | Admitting: Physical Medicine and Rehabilitation

## 2022-06-26 ENCOUNTER — Encounter: Payer: Medicare Other | Admitting: Physical Medicine and Rehabilitation

## 2023-05-19 ENCOUNTER — Other Ambulatory Visit: Payer: Self-pay | Admitting: Internal Medicine

## 2023-05-19 DIAGNOSIS — Z1231 Encounter for screening mammogram for malignant neoplasm of breast: Secondary | ICD-10-CM

## 2023-06-17 ENCOUNTER — Ambulatory Visit
Admission: RE | Admit: 2023-06-17 | Discharge: 2023-06-17 | Disposition: A | Discharge status: Home / Self Care | Source: Ambulatory Visit | Attending: Internal Medicine | Admitting: Internal Medicine

## 2023-06-17 DIAGNOSIS — Z1231 Encounter for screening mammogram for malignant neoplasm of breast: Secondary | ICD-10-CM
